# Patient Record
Sex: Female | Born: 1950 | Race: White | Hispanic: No | Marital: Married | Smoking: Never smoker
Health system: Southern US, Community
[De-identification: ages and names within clinical notes are randomized; demographics above are authoritative.]

## PROBLEM LIST (undated history)

## (undated) DIAGNOSIS — E785 Hyperlipidemia, unspecified: Secondary | ICD-10-CM

## (undated) DIAGNOSIS — T8859XA Other complications of anesthesia, initial encounter: Secondary | ICD-10-CM

## (undated) DIAGNOSIS — E119 Type 2 diabetes mellitus without complications: Secondary | ICD-10-CM

## (undated) DIAGNOSIS — IMO0001 Reserved for inherently not codable concepts without codable children: Secondary | ICD-10-CM

## (undated) DIAGNOSIS — T7840XA Allergy, unspecified, initial encounter: Secondary | ICD-10-CM

## (undated) DIAGNOSIS — T4145XA Adverse effect of unspecified anesthetic, initial encounter: Secondary | ICD-10-CM

## (undated) DIAGNOSIS — K219 Gastro-esophageal reflux disease without esophagitis: Secondary | ICD-10-CM

## (undated) DIAGNOSIS — M810 Age-related osteoporosis without current pathological fracture: Secondary | ICD-10-CM

## (undated) DIAGNOSIS — O1490 Unspecified pre-eclampsia, unspecified trimester: Secondary | ICD-10-CM

## (undated) HISTORY — DX: Type 2 diabetes mellitus without complications: E11.9

## (undated) HISTORY — DX: Allergy, unspecified, initial encounter: T78.40XA

## (undated) HISTORY — DX: Unspecified pre-eclampsia, unspecified trimester: O14.90

## (undated) HISTORY — DX: Age-related osteoporosis without current pathological fracture: M81.0

## (undated) HISTORY — DX: Other complications of anesthesia, initial encounter: T88.59XA

## (undated) HISTORY — DX: Adverse effect of unspecified anesthetic, initial encounter: T41.45XA

## (undated) HISTORY — DX: Gastro-esophageal reflux disease without esophagitis: K21.9

## (undated) HISTORY — DX: Hyperlipidemia, unspecified: E78.5

## (undated) HISTORY — PX: COLONOSCOPY: SHX174

## (undated) HISTORY — DX: Reserved for inherently not codable concepts without codable children: IMO0001

## (undated) HISTORY — PX: POLYPECTOMY: SHX149

---

## 2002-08-05 ENCOUNTER — Other Ambulatory Visit: Admission: RE | Admit: 2002-08-05 | Discharge: 2002-08-05 | Payer: Self-pay | Admitting: Family Medicine

## 2004-09-11 ENCOUNTER — Other Ambulatory Visit: Admission: RE | Admit: 2004-09-11 | Discharge: 2004-09-11 | Payer: Self-pay | Admitting: Family Medicine

## 2004-10-16 HISTORY — PX: CHOLECYSTECTOMY: SHX55

## 2004-10-22 ENCOUNTER — Other Ambulatory Visit: Payer: Self-pay

## 2004-10-29 ENCOUNTER — Ambulatory Visit: Payer: Self-pay | Admitting: Surgery

## 2005-09-20 ENCOUNTER — Ambulatory Visit: Payer: Self-pay | Admitting: Family Medicine

## 2006-04-07 ENCOUNTER — Ambulatory Visit: Payer: Self-pay | Admitting: Internal Medicine

## 2006-06-26 ENCOUNTER — Ambulatory Visit: Payer: Self-pay | Admitting: Family Medicine

## 2006-10-02 ENCOUNTER — Ambulatory Visit: Payer: Self-pay | Admitting: Family Medicine

## 2006-10-02 ENCOUNTER — Other Ambulatory Visit: Admission: RE | Admit: 2006-10-02 | Discharge: 2006-10-02 | Payer: Self-pay | Admitting: Family Medicine

## 2006-10-02 ENCOUNTER — Encounter: Payer: Self-pay | Admitting: Family Medicine

## 2006-10-02 LAB — CONVERTED CEMR LAB: Pap Smear: NORMAL

## 2006-10-24 LAB — FECAL OCCULT BLOOD, GUAIAC: Fecal Occult Blood: NEGATIVE

## 2006-10-27 ENCOUNTER — Ambulatory Visit: Payer: Self-pay | Admitting: Family Medicine

## 2006-11-13 ENCOUNTER — Ambulatory Visit: Payer: Self-pay | Admitting: Family Medicine

## 2006-11-27 ENCOUNTER — Ambulatory Visit: Payer: Self-pay | Admitting: Family Medicine

## 2007-02-10 ENCOUNTER — Ambulatory Visit: Payer: Self-pay | Admitting: Family Medicine

## 2007-04-07 ENCOUNTER — Ambulatory Visit: Payer: Self-pay | Admitting: Family Medicine

## 2007-04-07 LAB — CONVERTED CEMR LAB
ALT: 34 units/L (ref 0–40)
AST: 23 units/L (ref 0–37)
Albumin: 4 g/dL (ref 3.5–5.2)
Alkaline Phosphatase: 76 units/L (ref 39–117)
BUN: 7 mg/dL (ref 6–23)
Bilirubin, Direct: 0.1 mg/dL (ref 0.0–0.3)
CO2: 32 meq/L (ref 19–32)
Calcium: 8.9 mg/dL (ref 8.4–10.5)
Chloride: 103 meq/L (ref 96–112)
Creatinine, Ser: 0.6 mg/dL (ref 0.4–1.2)
Creatinine,U: 130.7 mg/dL
GFR calc Af Amer: 134 mL/min
GFR calc non Af Amer: 111 mL/min
Glucose, Bld: 172 mg/dL — ABNORMAL HIGH (ref 70–99)
Hgb A1c MFr Bld: 7.4 %
Hgb A1c MFr Bld: 7.4 % — ABNORMAL HIGH (ref 4.6–6.0)
Microalb Creat Ratio: 5.4 mg/g (ref 0.0–30.0)
Microalb, Ur: 0.7 mg/dL (ref 0.0–1.9)
Phosphorus: 4.1 mg/dL (ref 2.3–4.6)
Potassium: 4 meq/L (ref 3.5–5.1)
Sodium: 141 meq/L (ref 135–145)
Total Bilirubin: 0.7 mg/dL (ref 0.3–1.2)
Total Protein: 6.7 g/dL (ref 6.0–8.3)

## 2007-07-07 ENCOUNTER — Ambulatory Visit: Payer: Self-pay | Admitting: Family Medicine

## 2007-07-07 ENCOUNTER — Telehealth (INDEPENDENT_AMBULATORY_CARE_PROVIDER_SITE_OTHER): Payer: Self-pay | Admitting: *Deleted

## 2007-07-07 DIAGNOSIS — J45909 Unspecified asthma, uncomplicated: Secondary | ICD-10-CM | POA: Insufficient documentation

## 2007-07-07 DIAGNOSIS — I10 Essential (primary) hypertension: Secondary | ICD-10-CM | POA: Insufficient documentation

## 2007-07-07 DIAGNOSIS — J309 Allergic rhinitis, unspecified: Secondary | ICD-10-CM | POA: Insufficient documentation

## 2007-07-07 DIAGNOSIS — E7800 Pure hypercholesterolemia, unspecified: Secondary | ICD-10-CM

## 2007-07-07 DIAGNOSIS — E119 Type 2 diabetes mellitus without complications: Secondary | ICD-10-CM

## 2007-07-08 ENCOUNTER — Ambulatory Visit: Payer: Self-pay | Admitting: Family Medicine

## 2007-07-08 DIAGNOSIS — T753XXA Motion sickness, initial encounter: Secondary | ICD-10-CM | POA: Insufficient documentation

## 2007-07-09 LAB — CONVERTED CEMR LAB
ALT: 39 units/L — ABNORMAL HIGH (ref 0–35)
AST: 24 units/L (ref 0–37)
Cholesterol: 148 mg/dL (ref 0–200)
HDL: 25 mg/dL — ABNORMAL LOW (ref >39.0)
Hgb A1c MFr Bld: 6.9 % — ABNORMAL HIGH (ref 4.6–6.0)
LDL Cholesterol: 101 mg/dL — ABNORMAL HIGH (ref 0–99)
Total CHOL/HDL Ratio: 5.9
Triglycerides: 111 mg/dL (ref 0–149)
VLDL: 22 mg/dL (ref 0–40)

## 2007-09-22 ENCOUNTER — Telehealth: Payer: Self-pay | Admitting: Family Medicine

## 2007-10-09 ENCOUNTER — Ambulatory Visit: Payer: Self-pay | Admitting: Family Medicine

## 2007-10-12 ENCOUNTER — Encounter: Payer: Self-pay | Admitting: Family Medicine

## 2007-10-12 LAB — CONVERTED CEMR LAB
ALT: 31 units/L (ref 0–35)
Creatinine,U: 93.8 mg/dL
LDL Cholesterol: 112 mg/dL — ABNORMAL HIGH (ref 0–99)
Microalb, Ur: 0.4 mg/dL (ref 0.0–1.9)
Total CHOL/HDL Ratio: 4.7

## 2007-10-20 ENCOUNTER — Encounter (INDEPENDENT_AMBULATORY_CARE_PROVIDER_SITE_OTHER): Payer: Self-pay | Admitting: *Deleted

## 2007-10-20 LAB — HM MAMMOGRAPHY: HM Mammogram: NORMAL

## 2008-01-29 ENCOUNTER — Telehealth: Payer: Self-pay | Admitting: Family Medicine

## 2008-02-14 HISTORY — PX: NASAL SINUS SURGERY: SHX719

## 2008-03-01 ENCOUNTER — Encounter: Payer: Self-pay | Admitting: Family Medicine

## 2008-03-01 ENCOUNTER — Ambulatory Visit: Payer: Self-pay | Admitting: Unknown Physician Specialty

## 2008-03-01 ENCOUNTER — Other Ambulatory Visit: Payer: Self-pay

## 2008-03-02 ENCOUNTER — Ambulatory Visit: Payer: Self-pay | Admitting: Family Medicine

## 2008-03-02 ENCOUNTER — Telehealth: Payer: Self-pay | Admitting: Family Medicine

## 2008-03-03 ENCOUNTER — Telehealth: Payer: Self-pay | Admitting: Family Medicine

## 2008-03-08 ENCOUNTER — Ambulatory Visit: Payer: Self-pay | Admitting: Unknown Physician Specialty

## 2008-05-02 ENCOUNTER — Ambulatory Visit: Payer: Self-pay | Admitting: Family Medicine

## 2008-05-02 ENCOUNTER — Encounter (INDEPENDENT_AMBULATORY_CARE_PROVIDER_SITE_OTHER): Payer: Self-pay | Admitting: *Deleted

## 2008-05-05 LAB — CONVERTED CEMR LAB
CO2: 31 meq/L (ref 19–32)
Glucose, Bld: 112 mg/dL — ABNORMAL HIGH (ref 70–99)
HDL: 33.5 mg/dL — ABNORMAL LOW (ref >39.0)
Hgb A1c MFr Bld: 6.6 % — ABNORMAL HIGH (ref 4.6–6.0)
Potassium: 3.9 meq/L (ref 3.5–5.1)
Sodium: 142 meq/L (ref 135–145)
Total Bilirubin: 0.6 mg/dL (ref 0.3–1.2)
Total CHOL/HDL Ratio: 5.5
VLDL: 20 mg/dL (ref 0–40)

## 2008-05-27 ENCOUNTER — Ambulatory Visit: Payer: Self-pay | Admitting: Family Medicine

## 2008-05-27 DIAGNOSIS — E663 Overweight: Secondary | ICD-10-CM | POA: Insufficient documentation

## 2008-08-17 ENCOUNTER — Encounter: Payer: Self-pay | Admitting: Family Medicine

## 2008-08-18 ENCOUNTER — Encounter: Payer: Self-pay | Admitting: Family Medicine

## 2008-09-02 ENCOUNTER — Ambulatory Visit: Payer: Self-pay | Admitting: Family Medicine

## 2008-09-06 ENCOUNTER — Ambulatory Visit: Payer: Self-pay | Admitting: Family Medicine

## 2008-09-06 LAB — CONVERTED CEMR LAB
ALT: 25 units/L (ref 0–35)
AST: 15 units/L (ref 0–37)
Cholesterol: 146 mg/dL (ref 0–200)
HDL: 33.7 mg/dL — ABNORMAL LOW (ref >39.0)
VLDL: 21 mg/dL (ref 0–40)

## 2008-09-07 ENCOUNTER — Ambulatory Visit: Payer: Self-pay | Admitting: Family Medicine

## 2008-09-20 ENCOUNTER — Ambulatory Visit: Payer: Self-pay | Admitting: Family Medicine

## 2008-09-28 ENCOUNTER — Encounter: Payer: Self-pay | Admitting: Family Medicine

## 2008-10-04 ENCOUNTER — Ambulatory Visit: Payer: Self-pay | Admitting: Family Medicine

## 2009-02-01 ENCOUNTER — Encounter: Payer: Self-pay | Admitting: Family Medicine

## 2009-03-07 ENCOUNTER — Ambulatory Visit: Payer: Self-pay | Admitting: Family Medicine

## 2009-03-08 LAB — CONVERTED CEMR LAB
AST: 22 units/L (ref 0–37)
HDL: 37.8 mg/dL — ABNORMAL LOW (ref >39.00)
Hgb A1c MFr Bld: 6.9 % — ABNORMAL HIGH (ref 4.6–6.5)
Potassium: 4.1 meq/L (ref 3.5–5.1)
Sodium: 140 meq/L (ref 135–145)
Triglycerides: 103 mg/dL (ref 0.0–149.0)

## 2009-04-04 ENCOUNTER — Telehealth (INDEPENDENT_AMBULATORY_CARE_PROVIDER_SITE_OTHER): Payer: Self-pay | Admitting: *Deleted

## 2009-07-16 LAB — HM DIABETES EYE EXAM: HM Diabetic Eye Exam: NORMAL

## 2009-07-24 ENCOUNTER — Ambulatory Visit: Payer: Self-pay | Admitting: Family Medicine

## 2009-07-25 LAB — CONVERTED CEMR LAB
ALT: 36 units/L — ABNORMAL HIGH (ref 0–35)
AST: 21 units/L (ref 0–37)
Alkaline Phosphatase: 82 units/L (ref 39–117)
Basophils Relative: 0.4 % (ref 0.0–3.0)
Bilirubin, Direct: 0 mg/dL (ref 0.0–0.3)
Calcium: 9.1 mg/dL (ref 8.4–10.5)
Eosinophils Relative: 6.2 % — ABNORMAL HIGH (ref 0.0–5.0)
Glucose, Bld: 138 mg/dL — ABNORMAL HIGH (ref 70–99)
Hemoglobin: 14.1 g/dL (ref 12.0–15.0)
Lymphocytes Relative: 31.1 % (ref 12.0–46.0)
MCHC: 33.5 g/dL (ref 30.0–36.0)
Monocytes Relative: 7.2 % (ref 3.0–12.0)
Neutro Abs: 4.8 10*3/uL (ref 1.4–7.7)
Phosphorus: 3.9 mg/dL (ref 2.3–4.6)
Potassium: 4.1 meq/L (ref 3.5–5.1)
RBC: 4.46 M/uL (ref 3.87–5.11)
Sodium: 142 meq/L (ref 135–145)
Total CHOL/HDL Ratio: 5
Total Protein: 7.3 g/dL (ref 6.0–8.3)
VLDL: 24.6 mg/dL (ref 0.0–40.0)
WBC: 8.5 10*3/uL (ref 4.5–10.5)

## 2009-07-26 LAB — CONVERTED CEMR LAB: Hgb A1c MFr Bld: 7.2 % — ABNORMAL HIGH (ref 4.6–6.5)

## 2010-07-11 ENCOUNTER — Encounter: Payer: Self-pay | Admitting: Family Medicine

## 2010-09-28 ENCOUNTER — Ambulatory Visit: Payer: Self-pay | Admitting: Family Medicine

## 2010-09-30 LAB — CONVERTED CEMR LAB
ALT: 34 units/L (ref 0–35)
AST: 17 units/L (ref 0–37)
Hgb A1c MFr Bld: 7.3 % — ABNORMAL HIGH (ref 4.6–6.5)

## 2010-10-03 ENCOUNTER — Ambulatory Visit: Payer: Self-pay | Admitting: Family Medicine

## 2010-12-07 ENCOUNTER — Ambulatory Visit: Payer: Self-pay | Admitting: Internal Medicine

## 2011-01-02 ENCOUNTER — Ambulatory Visit
Admission: RE | Admit: 2011-01-02 | Discharge: 2011-01-02 | Payer: Self-pay | Source: Home / Self Care | Attending: Family Medicine | Admitting: Family Medicine

## 2011-01-02 ENCOUNTER — Other Ambulatory Visit: Payer: Self-pay | Admitting: Family Medicine

## 2011-01-02 LAB — LIPID PANEL
Cholesterol: 151 mg/dL (ref 0–200)
HDL: 32.2 mg/dL — ABNORMAL LOW (ref >39.00)
LDL Cholesterol: 102 mg/dL — ABNORMAL HIGH (ref 0–99)
Total CHOL/HDL Ratio: 5
Triglycerides: 85 mg/dL (ref 0.0–149.0)
VLDL: 17 mg/dL (ref 0.0–40.0)

## 2011-01-02 LAB — HEMOGLOBIN A1C: Hgb A1c MFr Bld: 7.4 % — ABNORMAL HIGH (ref 4.6–6.5)

## 2011-01-02 LAB — RENAL FUNCTION PANEL
Albumin: 4.1 g/dL (ref 3.5–5.2)
BUN: 7 mg/dL (ref 6–23)
CO2: 30 mEq/L (ref 19–32)
Calcium: 9.3 mg/dL (ref 8.4–10.5)
Chloride: 100 mEq/L (ref 96–112)
Creatinine, Ser: 0.4 mg/dL (ref 0.4–1.2)
GFR: 179.01 mL/min (ref >60.00)
Glucose, Bld: 131 mg/dL — ABNORMAL HIGH (ref 70–99)
Phosphorus: 4.5 mg/dL (ref 2.3–4.6)
Potassium: 4.3 mEq/L (ref 3.5–5.1)
Sodium: 137 mEq/L (ref 135–145)

## 2011-01-02 LAB — ALT: ALT: 32 U/L (ref 0–35)

## 2011-01-02 LAB — AST: AST: 22 U/L (ref 0–37)

## 2011-01-09 ENCOUNTER — Ambulatory Visit
Admission: RE | Admit: 2011-01-09 | Discharge: 2011-01-09 | Payer: Self-pay | Source: Home / Self Care | Attending: Family Medicine | Admitting: Family Medicine

## 2011-01-09 LAB — HM DIABETES FOOT EXAM

## 2011-01-15 NOTE — Letter (Signed)
Summary: [facility]  [facility]   Imported By: Maryln Gottron 07/27/2010 12:32:42  _____________________________________________________________________  External Attachment:    Type:   Image     Comment:   External Document

## 2011-01-15 NOTE — Assessment & Plan Note (Signed)
Summary: F/U/CLE   Vital Signs:  Patient profile:   60 year old female Height:      64.25 inches Weight:      200 pounds BMI:     34.19 Temp:     98.2 degrees F oral Pulse rate:   96 / minute Pulse rhythm:   regular BP sitting:   164 / 94  (left arm) Cuff size:   regular  Vitals Entered By: Lewanda Rife LPN (10/27/10 9:18 AM) CC: follow up for med refills   History of Present Illness: here for f/u of HTN and DM an llipids   wt is up 12 lb  bp is 164/94 hx of white coat HTN has not been checking at home   DM -- AIC 7.3 up from 7.2 is not eating much differently  is eating a diabetic diet   lipids up LDL is 166 from 129 on caduet   noncompliant with appts-- caring for her mother with health problems  also dementia   she does still walk every night 1 mile 20-30 minutes in neighborhood  not as much exercise as she used to  just joined [employer]   working part time as a Social worker    Allergies: 1)  ! * Xopenex  Past History:  Past Medical History: Last updated: 10/04/2008 Allergic rhinitis Asthma- mild Diabetes mellitus, type II Hypertension- "white coat" Toxemia X 2 during pregnancy chronic sinusitis/ had nasal surgery  Past Surgical History: Last updated: 09/06/2008 Caesarean section X 2 Cholecystectomy- gallstones (10/2004) nasal surg--3/09  Family History: Last updated: 27-Oct-2010 Father: HTN. died in garden from ? heat stroke or ?heart problems Mother: heart, DM, dementia  brother RA, kidney cancer   Social History: Last updated: 03/02/2008 Marital Status: Married Children: 2 Occupation:  Never Smoked Alcohol use-no occasional caffine   Risk Factors: Smoking Status: never (03/02/2008)  Family History: Father: HTN. died in garden from ? heat stroke or ?heart problems Mother: heart, DM, dementia  brother RA, kidney cancer   Review of Systems General:  Complains of fatigue; denies fever, loss of appetite, and malaise. Eyes:  Denies  blurring and eye irritation. CV:  Denies chest pain or discomfort, lightheadness, palpitations, and shortness of breath with exertion. Resp:  Denies cough and wheezing. GI:  Denies abdominal pain, bloody stools, change in bowel habits, and indigestion. GU:  Denies urinary frequency. MS:  Denies muscle aches and cramps. Derm:  Denies itching, lesion(s), poor wound healing, and rash. Neuro:  Denies numbness and tingling. Endo:  Denies excessive thirst and excessive urination. Heme:  Denies abnormal bruising and bleeding.  Physical Exam  General:  overweight but generally well appearing  Head:  normocephalic, atraumatic, and no abnormalities observed.   Eyes:  vision grossly intact, pupils equal, pupils round, and pupils reactive to light.  no conjunctival pallor, injection or icterus  Mouth:  pharynx pink and moist.   Neck:  supple with full rom and no masses or thyromegally, no JVD or carotid bruit  Lungs:  Normal respiratory effort, chest expands symmetrically. Lungs are clear to auscultation, no crackles or wheezes. Heart:  Normal rate and regular rhythm. S1 and S2 normal without gallop, murmur, click, rub or other extra sounds. Abdomen:  no renal bruits soft, non-tender, normal bowel sounds, no distention, no masses, no hepatomegaly, and no splenomegaly.   Msk:  No deformity or scoliosis noted of thoracic or lumbar spine.   Pulses:  R and L carotid,radial,femoral,dorsalis pedis and posterior tibial pulses are full  and equal bilaterally Extremities:  No clubbing, cyanosis, edema, or deformity noted with normal full range of motion of all joints.   Neurologic:  sensation intact to light touch, gait normal, and DTRs symmetrical and normal.   Skin:  Intact without suspicious lesions or rashes Cervical Nodes:  No lymphadenopathy noted Inguinal Nodes:  No significant adenopathy Psych:  normal affect, talkative and pleasant  seems stressed and somewhat fatigued   Diabetes Management Exam:     Foot Exam (with socks and/or shoes not present):       Sensory-Pinprick/Light touch:          Left medial foot (L-4): normal          Left dorsal foot (L-5): normal          Left lateral foot (S-1): normal          Right medial foot (L-4): normal          Right dorsal foot (L-5): normal          Right lateral foot (S-1): normal       Sensory-Monofilament:          Left foot: normal          Right foot: normal       Inspection:          Left foot: normal          Right foot: normal       Nails:          Left foot: normal          Right foot: normal   Impression & Recommendations:  Problem # 1:  HYPERTENSION (ICD-401.9) Assessment Deteriorated  bp is up  pt is off caduet and hyzaar went generic  she will start back on caduet check at home exercise f/u 3 mo  consider change in arb if not imp Her updated medication list for this problem includes:    Caduet 5-10 Mg Tabs (Amlodipine-atorvastatin) .Marland Kitchen... Take one by mouth daily    Hyzaar 100-25 Mg Tabs (Losartan potassium-hctz) .Marland Kitchen... 1 by mouth once daily  Orders: Prescription Created Electronically [phone])  Problem # 2:  DIABETES MELLITUS, TYPE II (ICD-250.00) Assessment: Deteriorated  slt worse will get back to diet/exercise and wt loss if not imp will inc metformin lab and f/u in3 mo Her updated medication list for this problem includes:    Glucotrol Xl 5 Mg Tb24 (Glipizide) .Marland Kitchen... Take one by mouth daily    Metformin Hcl 500 Mg Tabs (Metformin hcl) .Marland Kitchen... Take one half by mouth q hs    Hyzaar 100-25 Mg Tabs (Losartan potassium-hctz) .Marland Kitchen... 1 by mouth once daily  Labs Reviewed: Creat: 0.5 (07/24/2009)     Last Eye Exam: normal (07/16/2009) Reviewed HgBA1c results: 7.3 (09/28/2010)  7.2 (07/24/2009)  Orders: Prescription Created Electronically [phone])  Problem # 3:  HYPERCHOLESTEROLEMIA, PURE (ICD-272.0) Assessment: Deteriorated  worse off caduet  work on diet and start back lab and f/u 3 mo  Her updated  medication list for this problem includes:    Caduet 5-10 Mg Tabs (Amlodipine-atorvastatin) .Marland Kitchen... Take one by mouth daily  Labs Reviewed: SGOT: 17 (09/28/2010)   SGPT: 34 (09/28/2010)   HDL:36.80 (09/28/2010), 38.90 (07/24/2009)  LDL:129 (07/24/2009), 102 (03/07/2009)  Chol:240 (09/28/2010), 192 (07/24/2009)  Trig:154.0 (09/28/2010), 123.0 (07/24/2009)  Orders: Prescription Created Electronically [phone])  Complete Medication List: 1)  Glucotrol Xl 5 Mg Tb24 (Glipizide) .... Take one by mouth daily 2)  Caduet 5-10 Mg Tabs (Amlodipine-atorvastatin) .... Take  one by mouth daily 3)  Metformin Hcl 500 Mg Tabs (Metformin hcl) .... Take one half by mouth q hs 4)  Zyrtec Allergy 10 Mg Tabs (Cetirizine hcl) .... Take one by mouth daily 5)  Flonase 50 Mcg/act Susp (Fluticasone propionate) .... 2 sp in each nostril once daily 6)  Freestyle Test Strp (Glucose blood) .... Check blood sugar as directed 7)  Proair Hfa 108 (90 Base) Mcg/act Aers (Albuterol sulfate) .... As needed 8)  Alvesco 160 Mcg/act Aers (Ciclesonide) .... One puff two times a day as needed 9)  Hyzaar 100-25 Mg Tabs (Losartan potassium-hctz) .Marland Kitchen.. 1 by mouth once daily 10)  Ketoconazole 2 % Crea (Ketoconazole) .... Apply to affected area once daily 11)  Caltrate 600+d 600-400 Mg-unit Chew (Calcium carbonate-vitamin d) .... Chews two daily  Patient Instructions: 1)  I sent px to your pharmacy 2)  stick to diabetic diet  3)  cut portions by 1/4 to 1/3  4)  get back to the gym - optimally 5 days per week  5)  check blood pressure at home  6)  start back on caduet  7)  schedule fasting labs in 3 months then follow up lipid/ast/alt/renal / AIC / 250.0 , 272  8)  please have pt talk to [facility] or Aram Beecham on way out regarding problem with appt cancellation Prescriptions: HYZAAR 100-25 MG TABS (LOSARTAN POTASSIUM-HCTZ) 1 by mouth once daily  #30 x 11   Entered and Authorized by:   Judith Part MD   Signed by:   Judith Part MD on  10/03/2010   Method used:   Electron[facility] to        [facility] [phone][facility] (retail)       [address] [place], [place]  [address]       Ph: [phone] or [phone]       Fax: [phone]   RxID:   [id number] FLONASE 50 MCG/ACT  SUSP (FLUTICASONE PROPIONATE) 2 sp in each nostril once daily  #1 mdi x 11   Entered and Authorized by:   Judith Part MD   Signed by:   Judith Part MD on 10/03/2010   Method used:   Electronically to        [facility] [phone]* (retail)       [address] [place], [place]  [address]       Ph: [phone] or [phone]       Fax: [phone]   RxID:   [id number] METFORMIN HCL 500 MG  TABS (METFORMIN HCL) take one half by mouth q hs  #15 x 11   Entered and Authorized by:   Judith Part MD   Signed by:   Judith Part MD on 10/03/2010   Method used:   Electronically to        [facility] [phone]* (retail)       [address] [place], [place]  [address]       Ph: [phone] or [phone]       Fax: [phone]   RxID:   [id number] CADUET 5-10 MG  TABS (AMLODIPINE-ATORVASTATIN) take one by mouth daily  #30 x 11   Entered and Authorized by:  Judith Part MD   Signed by:   Judith Part MD on 10/03/2010   Method used:   Electronically to        [facility] [phone]* (retail)       [address] [place], [place]  [address]       Ph: [phone] or [phone]       Fax: [phone]   RxID:   [id number] GLUCOTROL XL 5 MG  TB24 (GLIPIZIDE) take one by mouth daily  #30 x 11   Entered and Authorized by:   Judith Part MD   Signed by:   Judith Part MD on 10/03/2010   Method used:   Electronically to        [facility] [phone]* (retail)       [address] [place], [place]  [address]       Ph: [phone] or [phone]       Fax: [phone]   RxID:    [id number]    Orders Added: 1)  Prescription Created Electronically [G8553] 2)  Est. Patient Level IV [23762]    Current Allergies (reviewed today): ! Pauline Aus

## 2011-01-17 NOTE — Assessment & Plan Note (Signed)
Summary: FELL AND TOOK BLOW TO LEFT SIDE/DLO   Vital Signs:  Patient profile:   61 year old female Weight:      199 pounds Temp:     98.3 degrees F oral Pulse rate:   104 / minute Pulse rhythm:   regular BP sitting:   136 / 80  (left arm) Cuff size:   large  Vitals Entered By: Selena Batten Dance CMA Duncan Dull) (December 07, 2010 3:40 PM) CC: Left ribcage painful after fall 1 week ago   History of Present Illness: CC: fall at campus  DOI: 12/01/2010  Fall Saturday at [facility].  fell on uneven pavement on L side, elbow bruise, knee pain.  since then, left chest pain.  Feels breath catching with deep breath and pain left side from breast to posterior ribs.  sleeping on recliner.  tylenol for pain.  No SOB.  No cough.  no dizziness.  no bleeding or bruising.  no smokers at home.  tachycardic today, pt states that's her norm.  Current Medications (verified): 1)  Glucotrol Xl 5 Mg  Tb24 (Glipizide) .... Take One By Mouth Daily 2)  Caduet 5-10 Mg  Tabs (Amlodipine-Atorvastatin) .... Take One By Mouth Daily 3)  Metformin Hcl 500 Mg  Tabs (Metformin Hcl) .... Take One Half By Mouth Q Hs 4)  Zyrtec Allergy 10 Mg  Tabs (Cetirizine Hcl) .... Take One By Mouth Daily 5)  Flonase 50 Mcg/act  Susp (Fluticasone Propionate) .... 2 Sp in Each Nostril Once Daily 6)  Freestyle Test   Strp (Glucose Blood) .... Check Blood Sugar As Directed 7)  Proair Hfa 108 (90 Base) Mcg/act Aers (Albuterol Sulfate) .... As Needed 8)  Alvesco 160 Mcg/act Aers (Ciclesonide) .... One Puff Two Times A Day As Needed 9)  Hyzaar 100-25 Mg Tabs (Losartan Potassium-Hctz) .Marland Kitchen.. 1 By Mouth Once Daily 10)  Ketoconazole 2 % Crea (Ketoconazole) .... Apply To Affected Area Once Daily 11)  Caltrate 600+d 600-400 Mg-Unit Chew (Calcium Carbonate-Vitamin D) .... Chews Two Daily  Allergies: 1)  ! * Xopenex  Past History:  Past Medical History: Last updated: 10/04/2008 Allergic rhinitis Asthma- mild Diabetes mellitus, type II Hypertension-  "white coat" Toxemia X 2 during pregnancy chronic sinusitis/ had nasal surgery  Social History: Last updated: 03/02/2008 Marital Status: Married Children: 2 Occupation:  Never Smoked Alcohol use-no occasional caffine   Review of Systems       per HPI  Physical Exam  General:  overweight but generally well appearing  Lungs:  Normal respiratory effort, chest expands symmetrically. Lungs are clear to auscultation, no crackles or wheezes. Heart:  Normal rate and regular rhythm. S1 and S2 normal without gallop, murmur, click, rub or other extra sounds. Abdomen:  no renal bruits soft, non-tender, normal bowel sounds, no distention, no masses, no hepatomegaly, and no splenomegaly.  no CVA tenderness. Msk:  bilateral knees FROM.  tender to palpation below L fibial lateral ligament.  Neg mcmurrays Pulses:  2+ rad pulses Extremities:  no pedal edema Neurologic:  sensation intact to light touch, gait normal, and DTRs symmetrical and normal.     Impression & Recommendations:  Problem # 1:  RIB PAIN, LEFT SIDED (ICD-786.50) likely bony contusion rib.  Xray negative for fracture.  supportive care.  red flags to return discussed - worsening SOB, worsening pain, dizziness etc.  discussed anticipated recovery course.  Orders: T-Ribs Unilateral 2 Views (71100TC)  Problem # 2:  KNEE PAIN, LEFT, ACUTE (ICD-719.46) likely bony contusion.  supportive care.  return if worse.  Complete Medication List: 1)  Glucotrol Xl 5 Mg Tb24 (Glipizide) .... Take one by mouth daily 2)  Caduet 5-10 Mg Tabs (Amlodipine-atorvastatin) .... Take one by mouth daily 3)  Metformin Hcl 500 Mg Tabs (Metformin hcl) .... Take one half by mouth q hs 4)  Zyrtec Allergy 10 Mg Tabs (Cetirizine hcl) .... Take one by mouth daily 5)  Flonase 50 Mcg/act Susp (Fluticasone propionate) .... 2 sp in each nostril once daily 6)  Freestyle Test Strp (Glucose blood) .... Check blood sugar as directed 7)  Proair Hfa 108 (90 Base)  Mcg/act Aers (Albuterol sulfate) .... As needed 8)  Alvesco 160 Mcg/act Aers (Ciclesonide) .... One puff two times a day as needed 9)  Hyzaar 100-25 Mg Tabs (Losartan potassium-hctz) .Marland Kitchen.. 1 by mouth once daily 10)  Ketoconazole 2 % Crea (Ketoconazole) .... Apply to affected area once daily 11)  Caltrate 600+d 600-400 Mg-unit Chew (Calcium carbonate-vitamin d) .... Chews two daily   Patient Instructions: 1)  Xray looking ok on my read.  we will call you if any change based on radiology read. 2)  advil and heating pad. 3)  update [place] if not better in 1-2 weeks, sooner if worse 4)  Good to see you today, call clinic with questions.   Orders Added: 1)  T-Ribs Unilateral 2 Views [71100TC] 2)  Est. Patient Level III [29562]    Current Allergies (reviewed today): ! Pauline Aus

## 2011-01-23 NOTE — Assessment & Plan Note (Signed)
Summary: 3 MONTH FOLLOW UP/[facility]   Vital Signs:  Patient profile:   60 year old female Height:      64.25 inches Weight:      197.75 pounds BMI:     33.80 Temp:     98 degrees F oral Pulse rate:   96 / minute Pulse rhythm:   regular BP sitting:   144 / 76  (left arm) Cuff size:   large  Vitals Entered By: Lewanda Rife LPN (January 09, 2011 8:11 AM)  Serial Vital Signs/Assessments:  Time      Position  BP       Pulse  Resp  Temp     By                     130/80                         Judith Part MD  CC: three month f/u   History of Present Illness: is doing ok overall  had a fall before holidays -- got some x rays and was ok  nothing broken  contused rib  otherwise ok   wt is down 2 lb  bp is up a bit -- whitecoat   got zumba exercise videos for christmas- really likes it   cholesterol is much better back on on caduet -- changed to generic   AIC is 7.4 checks it in am 82-125   occ checks in the evenings -- never over 140   check it 2 hours after dinner   is sticking to a diabetic diet  lost a little wt  oatmeal bkfast/ soup for lunch / dinner -- broccoli / salad/ grilled chicken no sweets no sweet drinks - watches bread / potato / pasta  Allergies: 1)  ! * Xopenex  Past History:  Past Medical History: Last updated: 10/04/2008 Allergic rhinitis Asthma- mild Diabetes mellitus, type II Hypertension- "white coat" Toxemia X 2 during pregnancy chronic sinusitis/ had nasal surgery  Past Surgical History: Last updated: 09/06/2008 Caesarean section X 2 Cholecystectomy- gallstones (10/2004) nasal surg--3/09  Family History: Last updated: 10-21-10 Father: HTN. died in garden from ? heat stroke or ?heart problems Mother: heart, DM, dementia  brother RA, kidney cancer   Social History: Last updated: 03/02/2008 Marital Status: Married Children: 2 Occupation:  Never Smoked Alcohol use-no occasional caffine   Risk Factors: Smoking Status: never  (03/02/2008)  Review of Systems General:  Denies fatigue, loss of appetite, and malaise. Eyes:  Denies blurring and eye irritation. CV:  Denies chest pain or discomfort, lightheadness, and palpitations. Resp:  Denies cough, shortness of breath, and wheezing. GI:  Denies abdominal pain and change in bowel habits. GU:  Denies dysuria and urinary frequency. MS:  occ r leg gets tingly when she stands for a long time . Derm:  Denies itching, lesion(s), poor wound healing, and rash. Neuro:  Denies numbness and tingling. Psych:  mood is ok . Endo:  Denies cold intolerance and heat intolerance. Heme:  Denies abnormal bruising and bleeding.  Physical Exam  General:  overweight but generally well appearing  Head:  normocephalic, atraumatic, and no abnormalities observed.   Eyes:  vision grossly intact, pupils equal, pupils round, and pupils reactive to light.  no conjunctival pallor, injection or icterus  Mouth:  pharynx pink and moist.   Neck:  supple with full rom and no masses or thyromegally, no JVD or carotid bruit  Chest Wall:  No deformities, masses, or tenderness noted. Lungs:  Normal respiratory effort, chest expands symmetrically. Lungs are clear to auscultation, no crackles or wheezes. Heart:  Normal rate and regular rhythm. S1 and S2 normal without gallop, murmur, click, rub or other extra sounds. Abdomen:  Bowel sounds positive,abdomen soft and non-tender without masses, organomegaly or hernias noted. no renal bruits obese abd baseline scars  Msk:  No deformity or scoliosis noted of thoracic or lumbar spine.   Pulses:  2+ rad pulses Extremities:  no pedal edema Neurologic:  sensation intact to light touch, gait normal, and DTRs symmetrical and normal.   Skin:  Intact without suspicious lesions or rashes Cervical Nodes:  No lymphadenopathy noted Inguinal Nodes:  No significant adenopathy Psych:  normal affect, talkative and pleasant   Diabetes Management Exam:    Foot Exam  (with socks and/or shoes not present):       Sensory-Pinprick/Light touch:          Left medial foot (L-4): normal          Left dorsal foot (L-5): normal          Left lateral foot (S-1): normal          Right medial foot (L-4): normal          Right dorsal foot (L-5): normal          Right lateral foot (S-1): normal       Sensory-Monofilament:          Left foot: normal          Right foot: normal       Inspection:          Left foot: normal          Right foot: normal       Nails:          Left foot: normal          Right foot: normal   Impression & Recommendations:  Problem # 1:  HYPERTENSION (ICD-401.9) Assessment Unchanged  better on 2nd check  continue current meds/ wt loss effort Her updated medication list for this problem includes:    Caduet 5-10 Mg Tabs (Amlodipine-atorvastatin) .Marland Kitchen... Take one by mouth daily    Hyzaar 100-25 Mg Tabs (Losartan potassium-hctz) .Marland Kitchen... 1 by mouth once daily  BP today: 144/76-- re check 130/80 Prior BP: 136/80 (12/07/2010)  Labs Reviewed: K+: 4.3 (01/02/2011) Creat: : 0.4 (01/02/2011)   Chol: 151 (01/02/2011)   HDL: 32.20 (01/02/2011)   LDL: 102 (01/02/2011)   TG: 85.0 (01/02/2011)  Problem # 2:  DIABETES MELLITUS, TYPE II (ICD-250.00) Assessment: Deteriorated  this is not optimally controlled - despite good fasting sugars disc fact that sugar has to be getting high at other times in the day... will check glucose two times a day  inc metformin to 500 two times a day - update if side eff or low sugar  stick to dm diet and exercise  f/u 3 mo after labs  Her updated medication list for this problem includes:    Glucotrol Xl 5 Mg Tb24 (Glipizide) .Marland Kitchen... Take one by mouth daily    Metformin Hcl 500 Mg Tabs (Metformin hcl) .Marland Kitchen... 1 by mouth two times a day    Hyzaar 100-25 Mg Tabs (Losartan potassium-hctz) .Marland Kitchen... 1 by mouth once daily  Orders: Prescription Created Electronically [phone])  Problem # 3:  HYPERCHOLESTEROLEMIA, PURE  (ICD-272.0) Assessment: Improved  this is much imp with caduet  rev labs  rev goal rev low sat fat diet   Her updated medication list for this problem includes:    Caduet 5-10 Mg Tabs (Amlodipine-atorvastatin) .Marland Kitchen... Take one by mouth daily  Labs Reviewed: SGOT: 22 (01/02/2011)   SGPT: 32 (01/02/2011)   HDL:32.20 (01/02/2011), 36.80 (09/28/2010)  LDL:102 (01/02/2011), 129 (07/24/2009)  Chol:151 (01/02/2011), 240 (09/28/2010)  Trig:85.0 (01/02/2011), 154.0 (09/28/2010)  Problem # 4:  OBESITY (ICD-278.00) Assessment: Unchanged rev need for wt loss diet and exercise are good now- but suspect she will need to cut her portions   Complete Medication List: 1)  Glucotrol Xl 5 Mg Tb24 (Glipizide) .... Take one by mouth daily 2)  Caduet 5-10 Mg Tabs (Amlodipine-atorvastatin) .... Take one by mouth daily 3)  Metformin Hcl 500 Mg Tabs (Metformin hcl) .Marland Kitchen.. 1 by mouth two times a day 4)  Zyrtec Allergy 10 Mg Tabs (Cetirizine hcl) .... Take one by mouth daily 5)  Flonase 50 Mcg/act Susp (Fluticasone propionate) .... 2 sp in each nostril once daily 6)  Freestyle Test Strp (Glucose blood) .... Check blood sugar as directed 7)  Proair Hfa 108 (90 Base) Mcg/act Aers (Albuterol sulfate) .... As needed 8)  Alvesco 160 Mcg/act Aers (Ciclesonide) .... One puff two times a day as needed 9)  Hyzaar 100-25 Mg Tabs (Losartan potassium-hctz) .Marland Kitchen.. 1 by mouth once daily 10)  Ketoconazole 2 % Crea (Ketoconazole) .... Apply to affected area once daily 11)  Caltrate 600+d 600-400 Mg-unit Chew (Calcium carbonate-vitamin d) .... Chews two daily  Patient Instructions: 1)  take metformin 500 mg one pill two times a day  2)  stick to diabetic diet - you may have to reduce portions for weight loss 3)  keep up the exercise- aim for at least 5 days per week  4)  if any side effects or low sugar let me know 5)  check sugar fasting in am and then 2 hours after biggest meal of the day  6)  schedule labs and f/u in 3 months  AIC and renal panel 250.0  Prescriptions: METFORMIN HCL 500 MG TABS (METFORMIN HCL) 1 by mouth two times a day  #60 x 11   Entered and Authorized by:   Judith Part MD   Signed by:   Judith Part MD on 01/09/2011   Method used:   Electron[facility] to        [facility] [phone]* (retail)       [address] [place], [place]  [address]       Ph: [phone] or [phone]       Fax: [phone]   RxID:   [id number]    Orders Added: 1)  Prescription Created Electronically [G8553] 2)  Est. Patient Level IV [36644]     Current Allergies (reviewed today): ! Pauline Aus

## 2011-02-18 ENCOUNTER — Encounter: Payer: Self-pay | Admitting: Family Medicine

## 2011-02-18 DIAGNOSIS — J45909 Unspecified asthma, uncomplicated: Secondary | ICD-10-CM | POA: Insufficient documentation

## 2011-02-18 DIAGNOSIS — IMO0001 Reserved for inherently not codable concepts without codable children: Secondary | ICD-10-CM

## 2011-02-18 DIAGNOSIS — E119 Type 2 diabetes mellitus without complications: Secondary | ICD-10-CM | POA: Insufficient documentation

## 2011-02-18 DIAGNOSIS — J309 Allergic rhinitis, unspecified: Secondary | ICD-10-CM | POA: Insufficient documentation

## 2011-02-18 DIAGNOSIS — J329 Chronic sinusitis, unspecified: Secondary | ICD-10-CM | POA: Insufficient documentation

## 2011-02-18 DIAGNOSIS — O1490 Unspecified pre-eclampsia, unspecified trimester: Secondary | ICD-10-CM | POA: Insufficient documentation

## 2011-03-29 ENCOUNTER — Other Ambulatory Visit: Payer: Self-pay | Admitting: *Deleted

## 2011-04-03 ENCOUNTER — Other Ambulatory Visit (INDEPENDENT_AMBULATORY_CARE_PROVIDER_SITE_OTHER): Payer: BC Managed Care – PPO | Admitting: Family Medicine

## 2011-04-03 DIAGNOSIS — E119 Type 2 diabetes mellitus without complications: Secondary | ICD-10-CM

## 2011-04-03 LAB — RENAL FUNCTION PANEL
Albumin: 3.9 g/dL (ref 3.5–5.2)
BUN: 9 mg/dL (ref 6–23)
Calcium: 9.1 mg/dL (ref 8.4–10.5)
Creatinine, Ser: 0.4 mg/dL (ref 0.4–1.2)
Glucose, Bld: 95 mg/dL (ref 70–99)
Sodium: 140 mEq/L (ref 135–145)

## 2011-04-10 ENCOUNTER — Encounter: Payer: Self-pay | Admitting: Family Medicine

## 2011-04-10 ENCOUNTER — Ambulatory Visit (INDEPENDENT_AMBULATORY_CARE_PROVIDER_SITE_OTHER): Payer: BC Managed Care – PPO | Admitting: Family Medicine

## 2011-04-10 DIAGNOSIS — E119 Type 2 diabetes mellitus without complications: Secondary | ICD-10-CM

## 2011-04-10 DIAGNOSIS — I10 Essential (primary) hypertension: Secondary | ICD-10-CM

## 2011-04-10 DIAGNOSIS — J45909 Unspecified asthma, uncomplicated: Secondary | ICD-10-CM

## 2011-04-10 DIAGNOSIS — E669 Obesity, unspecified: Secondary | ICD-10-CM

## 2011-04-10 MED ORDER — GLUCOSE BLOOD VI STRP: ORAL_STRIP | Status: AC

## 2011-04-10 NOTE — Patient Instructions (Signed)
I want to see some blood sugars from different times of day Am , before lunch, after lunch , after dinner , bedtime-- mix it up -- keep a log  You may have to buy a box of strips out of pocket for this  Continue current diet and exercise No change in medicine  Schedule fasting lab and follow up in about 3 months You are doing great

## 2011-04-10 NOTE — Assessment & Plan Note (Signed)
Getting under control with inhalers after pollen season  Did remind pt to use albuterol before exercise Nl exam today

## 2011-04-10 NOTE — Assessment & Plan Note (Signed)
Getting closer to goal with wt loss and diet and exercise Will check sugar more often - randomly for a month to notice trends  Lab and f/u 3 mo Good foot care

## 2011-04-10 NOTE — Assessment & Plan Note (Signed)
Better on 2nd check Some whitecoat component Continue the hyzaar  F/u 3 mo  Labs rev today

## 2011-04-10 NOTE — Progress Notes (Signed)
Subjective:    Patient ID: Katrina Smith, female    DOB: [birth year], 60 y.o.   MRN: [id number]  HPI Here for f/u of DM and HTN and obesity  Is doing pretty good Feeling good   Wt is down 8 lb- has really worked on it  Diet- using a dm meal replacement shake for lunch - that is convenient  Exercise -- still walking every day -- for at least 30 minutes  Is checking sugar - in ams is 82     DM-- a1c is down from 7.4 to 7.1 which is good  On glipizide and metformin  Glipizide in am 5 mg  Taking metformin 500 in the evening 1 pill  Does not check sugar during the day often  2 hours after dinner - up to 140   For breakfast oatmeal - plain --instant  luch is the meal repl shake-- with a serving of fruit  For dinner - soups - vegetable (not creamy) - and salad   Cannot afford strips  Needs accucheck compact plus strips    bp is 142/74 first check Known whitecoat htn in past  On hyzaar   Asthma is seasonal - worse earlier in the pollen season Getting better Inhaler as needed - esp for exercise   Past Medical History  Diagnosis Date  . Allergic rhinitis   . Asthma     mild  . Diabetes mellitus type II   . HTN, white coat   . Toxemia in pregnancy     x 2  . Chronic sinusitis     had nasal surgery   Past Surgical History  Procedure Date  . Cesarean section     x2  . Cholecystectomy 10/2004    gallstones  . Nasal sinus surgery 02/2008    reports that she has never smoked. She does not have any smokeless tobacco history on file. She reports that she does not drink alcohol. Her drug history not on file. family history includes Cancer in her brother; Dementia in her mother; Diabetes in her mother; Heart disease in her mother; Hypertension in her father; and Rheum arthritis in her brother. Allergies  Allergen Reactions  . Xopenex (Levalbuterol Hydrochloride) Other (See Comments)    Throat closed up.       Review of Systems  Review of Systems  Constitutional: Negative  for fever, appetite change, fatigue and unexpected weight change.  Eyes: Negative for pain and visual disturbance.  ENT: nasal congestion/ rhinorrhea  Respiratory: Negative for cough and shortness of breath.  , pos for wheezing occas  Cardiovascular: Negative.   Gastrointestinal: Negative for nausea, diarrhea and constipation.  Genitourinary: Negative for urgency and frequency.  Skin: Negative for pallor.  Neurological: Negative for weakness, light-headedness, numbness and headaches.  Hematological: Negative for adenopathy. Does not bruise/bleed easily.  Psychiatric/Behavioral: Negative for dysphoric mood. The patient is not nervous/anxious.          Objective:   Physical Exam  Constitutional: She appears well-developed and well-nourished.       overwt and well appearing   HENT:  Head: Normocephalic and atraumatic.  Right Ear: External ear normal.  Left Ear: External ear normal.  Mouth/Throat: Oropharynx is clear and moist.       Nares are boggy and pale   Eyes: Conjunctivae and EOM are normal. Pupils are equal, round, and reactive to light.  Neck: Normal range of motion. Neck supple. No JVD present. Carotid bruit is not present. No thyromegaly present.  Cardiovascular:  Normal rate, regular rhythm and normal heart sounds.   Pulmonary/Chest: Effort normal and breath sounds normal.       Scant wheeze on forced exp only  Abdominal: Soft. Bowel sounds are normal. She exhibits no distension and no mass. There is no tenderness.  Musculoskeletal: She exhibits no edema and no tenderness.  Lymphadenopathy:    She has no cervical adenopathy.  Neurological: She is alert. She has normal reflexes. Coordination normal.  Skin: Skin is warm and dry. No rash noted. No erythema. No pallor.  Psychiatric: She has a normal mood and affect.          Assessment & Plan:

## 2011-04-10 NOTE — Assessment & Plan Note (Signed)
Commended on wt loss so far with good habits

## 2011-07-03 ENCOUNTER — Other Ambulatory Visit (INDEPENDENT_AMBULATORY_CARE_PROVIDER_SITE_OTHER): Payer: BC Managed Care – PPO | Admitting: Family Medicine

## 2011-07-03 DIAGNOSIS — I10 Essential (primary) hypertension: Secondary | ICD-10-CM

## 2011-07-03 DIAGNOSIS — E119 Type 2 diabetes mellitus without complications: Secondary | ICD-10-CM

## 2011-07-03 LAB — HEMOGLOBIN A1C: Hgb A1c MFr Bld: 7.3 % — ABNORMAL HIGH (ref 4.6–6.5)

## 2011-07-03 LAB — RENAL FUNCTION PANEL
Albumin: 4.5 g/dL (ref 3.5–5.2)
Creatinine, Ser: 0.6 mg/dL (ref 0.4–1.2)
Glucose, Bld: 113 mg/dL — ABNORMAL HIGH (ref 70–99)
Phosphorus: 4 mg/dL (ref 2.3–4.6)
Potassium: 4.2 mEq/L (ref 3.5–5.1)
Sodium: 139 mEq/L (ref 135–145)

## 2011-07-03 LAB — LIPID PANEL
HDL: 41.4 mg/dL (ref >39.00)
LDL Cholesterol: 89 mg/dL (ref 0–99)
VLDL: 23.2 mg/dL (ref 0.0–40.0)

## 2011-07-05 ENCOUNTER — Other Ambulatory Visit: Payer: BC Managed Care – PPO

## 2011-07-10 ENCOUNTER — Ambulatory Visit: Payer: BC Managed Care – PPO | Admitting: Family Medicine

## 2011-08-02 ENCOUNTER — Ambulatory Visit: Payer: BC Managed Care – PPO | Admitting: Family Medicine

## 2011-08-14 ENCOUNTER — Ambulatory Visit: Payer: BC Managed Care – PPO | Admitting: Family Medicine

## 2011-09-04 ENCOUNTER — Ambulatory Visit: Payer: BC Managed Care – PPO | Admitting: Family Medicine

## 2011-09-30 ENCOUNTER — Telehealth: Payer: Self-pay | Admitting: *Deleted

## 2011-09-30 NOTE — Telephone Encounter (Signed)
Left message for pt to call back  °

## 2011-09-30 NOTE — Telephone Encounter (Signed)
Yes, please sched lab fasting for a1c and lipid prof and cmet and then f/u with me 401.1, 272 and 250.0 (HTN and DM and lipid) Thanks

## 2011-09-30 NOTE — Telephone Encounter (Signed)
Pt was to have come in in august to follow up with lab work that she had in july.  She has been unable to come in for the follow up due to her work schedule.  She says it's now time to repeat the lab work and she is asking if ok to schedule the lab appt and then have a follow up with you.  Please advise.  Please leave message on her home answering machine.

## 2011-10-01 ENCOUNTER — Other Ambulatory Visit: Payer: Self-pay | Admitting: Family Medicine

## 2011-10-01 DIAGNOSIS — E7800 Pure hypercholesterolemia, unspecified: Secondary | ICD-10-CM

## 2011-10-01 DIAGNOSIS — I10 Essential (primary) hypertension: Secondary | ICD-10-CM

## 2011-10-01 NOTE — Telephone Encounter (Signed)
Patient notified as instructed by telephone. Pt scheduled appt with Dr Milinda Antis 10/23/11 at 9am. And Fasting Lab appt scheduled as instructed 10/16/11.

## 2011-10-02 ENCOUNTER — Ambulatory Visit: Payer: BC Managed Care – PPO | Admitting: Family Medicine

## 2011-10-03 ENCOUNTER — Other Ambulatory Visit: Payer: Self-pay | Admitting: *Deleted

## 2011-10-03 MED ORDER — AMLODIPINE-ATORVASTATIN 5-10 MG PO TABS: 1.0000 | ORAL_TABLET | Freq: Every day | ORAL | Status: DC

## 2011-10-16 ENCOUNTER — Other Ambulatory Visit (INDEPENDENT_AMBULATORY_CARE_PROVIDER_SITE_OTHER): Payer: BC Managed Care – PPO

## 2011-10-16 DIAGNOSIS — I10 Essential (primary) hypertension: Secondary | ICD-10-CM

## 2011-10-16 DIAGNOSIS — E119 Type 2 diabetes mellitus without complications: Secondary | ICD-10-CM

## 2011-10-16 DIAGNOSIS — E7800 Pure hypercholesterolemia, unspecified: Secondary | ICD-10-CM

## 2011-10-16 LAB — COMPREHENSIVE METABOLIC PANEL
ALT: 26 U/L (ref 0–35)
AST: 16 U/L (ref 0–37)
Albumin: 4.1 g/dL (ref 3.5–5.2)
CO2: 28 mEq/L (ref 19–32)
Calcium: 9.1 mg/dL (ref 8.4–10.5)
Chloride: 102 mEq/L (ref 96–112)
Creatinine, Ser: 0.5 mg/dL (ref 0.4–1.2)
GFR: 134.03 mL/min (ref >60.00)
Potassium: 4 mEq/L (ref 3.5–5.1)
Total Protein: 7.2 g/dL (ref 6.0–8.3)

## 2011-10-16 LAB — LIPID PANEL
Total CHOL/HDL Ratio: 4
Triglycerides: 127 mg/dL (ref 0.0–149.0)

## 2011-10-23 ENCOUNTER — Ambulatory Visit: Payer: BC Managed Care – PPO | Admitting: Family Medicine

## 2011-10-30 ENCOUNTER — Ambulatory Visit (INDEPENDENT_AMBULATORY_CARE_PROVIDER_SITE_OTHER): Payer: BC Managed Care – PPO | Admitting: Family Medicine

## 2011-10-30 ENCOUNTER — Encounter: Payer: Self-pay | Admitting: Family Medicine

## 2011-10-30 VITALS — BP 128/78 | HR 96 | Temp 97.9°F | Ht 64.25 in | Wt 192.5 lb

## 2011-10-30 DIAGNOSIS — I10 Essential (primary) hypertension: Secondary | ICD-10-CM

## 2011-10-30 DIAGNOSIS — E7800 Pure hypercholesterolemia, unspecified: Secondary | ICD-10-CM

## 2011-10-30 DIAGNOSIS — E119 Type 2 diabetes mellitus without complications: Secondary | ICD-10-CM

## 2011-10-30 DIAGNOSIS — Z23 Encounter for immunization: Secondary | ICD-10-CM

## 2011-10-30 NOTE — Assessment & Plan Note (Signed)
Slowly improving  Good home sugars checking bid - very few above 120 Rev low glycemic diet  On ARB Will make own opthy appt Will start exercise  Rev foot care  F/u 6 mo  Lab Results  Component Value Date   HGBA1C 7.2* 10/16/2011   flu shot today  Will get pneumovax next time

## 2011-10-30 NOTE — Assessment & Plan Note (Signed)
bp in fair control at this time  No changes needed  Disc lifstyle change with low sodium diet and exercise   On ARB for renal protection as well  F/u 6 mo

## 2011-10-30 NOTE — Assessment & Plan Note (Signed)
Disc goals for lipids and reasons to control them Rev labs with pt Rev low sat fat diet in detail Good control with caduet  Enc to exercise

## 2011-10-30 NOTE — Patient Instructions (Signed)
Flu shot today  Keep up the good diet and weight loss effort  Exercise 5 days per week for 30 minutes  Don't forget to schedule your annual eye exam  Follow up in 6 months with labs prior

## 2011-10-30 NOTE — Progress Notes (Signed)
Subjective:    Patient ID: Katrina Smith, female    DOB: 1951/11/13, 60 y.o.   MRN: [id number]  HPI Here for f/u of HTN and DM and lipids  Is feeling good in general   128/78 bp today- HTN is in good control Wt is up 3 lb with bmi of 32 No cp or palp or ha No change in med Pulse sometimes high - ? Due to coffee    DM - sugar 109 fasting with a1c 7.2 down from 7.3- gradually coming down  On glipizide and metformin  Checking sugar bid - and most sugars are below 120  Has an occasional spike - to 160 Has a graph to look at  ? How sugar is in middle of the night  Disc lifestyle habits last visit Has not been exercising as much - though did join the [employer] (working more)  Takes care of her mother with dementia  Walks the dog every night - outside exercise makes her wheeze in the fall  Has some zumba videos Is really trying to eat healthy opthy - has been a year- she will set that up  On ARB  Lipids well controlled with caduet and diet  Lab Results  Component Value Date   CHOL 156 10/16/2011   CHOL 154 07/03/2011   CHOL 151 01/02/2011   Lab Results  Component Value Date   HDL 42.20 10/16/2011   HDL 41.40 07/03/2011   HDL 82.95* 01/02/2011   Lab Results  Component Value Date   LDLCALC 88 10/16/2011   LDLCALC 89 07/03/2011   LDLCALC 102* 01/02/2011   Lab Results  Component Value Date   TRIG 127.0 10/16/2011   TRIG 116.0 07/03/2011   TRIG 85.0 01/02/2011   Lab Results  Component Value Date   CHOLHDL 4 10/16/2011   CHOLHDL 4 07/03/2011   CHOLHDL 5 01/02/2011   Lab Results  Component Value Date   LDLDIRECT 166.9 09/28/2010    Flu shot --is required to get this for work  Has a very slight allergy to chicken -- and does eat chicken regularly and eggs and is fine  Has seen Dr Jenne Campus and then Dr Ruby Cola- said it would be ok  Pneumovax -? If she has had   Patient Active Problem List  Diagnoses  . DM w/o Complication Type II  . HYPERCHOLESTEROLEMIA, PURE  . OBESITY  .  HYPERTENSION  . ALLERGIC RHINITIS  . ASTHMA  . MOTION SICKNESS  . Allergic rhinitis  . Toxemia in pregnancy  . Chronic sinusitis   Past Medical History  Diagnosis Date  . Allergic rhinitis   . Asthma     mild  . Diabetes mellitus type II   . HTN, white coat   . Toxemia in pregnancy     x 2  . Chronic sinusitis     had nasal surgery   Past Surgical History  Procedure Date  . Cesarean section     x2  . Cholecystectomy 10/2004    gallstones  . Nasal sinus surgery 02/2008   History  Substance Use Topics  . Smoking status: Never Smoker   . Smokeless tobacco: Not on file  . Alcohol Use: No   Family History  Problem Relation Age of Onset  . Hypertension Father   . Diabetes Mother   . Dementia Mother   . Heart disease Mother   . Rheum arthritis Brother   . Cancer Brother     kidney   Allergies  Allergen Reactions  . Xopenex (Levalbuterol Hydrochloride) Other (See Comments)    Throat closed up.   Current Outpatient Prescriptions on File Prior to Visit  Medication Sig Dispense Refill  . amLODipine-atorvastatin (CADUET) 5-10 MG per tablet Take 1 tablet by mouth daily.  30 tablet  0  . Calcium Carbonate-Vitamin D (CALTRATE 600+D) 600-400 MG-UNIT per chew tablet Chew 2 tablets by mouth daily.        . cetirizine (ZYRTEC) 10 MG tablet Take 10 mg by mouth daily.        . ciclesonide (ALVESCO) 160 MCG/ACT inhaler Inhale 1 puff into the lungs 2 (two) times daily as needed.       . fluticasone (FLONASE) 50 MCG/ACT nasal spray 2 sprays by Nasal route daily.        Marland Kitchen glipiZIDE (GLUCOTROL) 5 MG 24 hr tablet Take 5 mg by mouth daily.        Marland Kitchen glucose blood (ACCU-CHEK COMPACT TEST DRUM) test strip Use as instructed  100 each  12  . ketoconazole (NIZORAL) 2 % cream Apply 1 application topically daily.        Marland Kitchen losartan-hydrochlorothiazide (HYZAAR) 100-25 MG per tablet Take 1 tablet by mouth daily.        . metFORMIN (GLUCOPHAGE) 500 MG tablet Take 500 mg by mouth daily.       .  Omega-3 Fatty Acids (FISH OIL CONCENTRATE PO) Take 1 capsule by mouth 2 (two) times daily.        Marland Kitchen albuterol (PROAIR HFA) 108 (90 BASE) MCG/ACT inhaler Inhale 2 puffs into the lungs every 6 (six) hours as needed.        Marland Kitchen glucose blood test strip 1 each by Other route as directed. Use as instructed           Review of Systems Review of Systems  Constitutional: Negative for fever, appetite change, fatigue and unexpected weight change.  Eyes: Negative for pain and visual disturbance.  ENT pos for chronic congestion from allergies  Respiratory: Negative for cough and shortness of breath.   Cardiovascular: Negative for cp or palpitations    Gastrointestinal: Negative for nausea, diarrhea and constipation.  Genitourinary: Negative for urgency and frequency.  Skin: Negative for pallor or rash   Neurological: Negative for weakness, light-headedness, numbness and headaches.  Hematological: Negative for adenopathy. Does not bruise/bleed easily.  Psychiatric/Behavioral: Negative for dysphoric mood. The patient is not nervous/anxious.          Objective:   Physical Exam  Constitutional: She appears well-developed and well-nourished. No distress.       overwt and well appearing   HENT:  Head: Normocephalic and atraumatic.  Mouth/Throat: Oropharynx is clear and moist.       Nares are boggy and congested   Eyes: Conjunctivae and EOM are normal. Pupils are equal, round, and reactive to light. No scleral icterus.  Neck: Normal range of motion. Neck supple. No JVD present. Carotid bruit is not present. No thyromegaly present.  Cardiovascular: Normal rate, regular rhythm, normal heart sounds and intact distal pulses.  Exam reveals no gallop.   Pulmonary/Chest: Effort normal and breath sounds normal. No respiratory distress. She has no wheezes. She exhibits no tenderness.  Abdominal: Soft. Bowel sounds are normal. She exhibits no distension, no abdominal bruit and no mass. There is no tenderness.    Musculoskeletal: Normal range of motion. She exhibits no edema and no tenderness.  Lymphadenopathy:    She has no cervical adenopathy.  Neurological: She  is alert. She has normal reflexes. No cranial nerve deficit. She exhibits normal muscle tone. Coordination abnormal.  Skin: Skin is warm and dry. No rash noted. No erythema. No pallor.  Psychiatric: She has a normal mood and affect.          Assessment & Plan:

## 2011-11-18 ENCOUNTER — Other Ambulatory Visit: Payer: Self-pay | Admitting: Family Medicine

## 2011-12-25 ENCOUNTER — Other Ambulatory Visit: Payer: Self-pay | Admitting: Family Medicine

## 2011-12-26 NOTE — Telephone Encounter (Signed)
[facility] [address] request refill Caduet 5-10 mg #30 x 11.

## 2012-02-18 ENCOUNTER — Other Ambulatory Visit: Payer: Self-pay | Admitting: Family Medicine

## 2012-03-26 ENCOUNTER — Other Ambulatory Visit: Payer: Self-pay | Admitting: Allergy and Immunology

## 2012-03-26 ENCOUNTER — Ambulatory Visit
Admission: RE | Admit: 2012-03-26 | Discharge: 2012-03-26 | Disposition: A | Discharge status: Home / Self Care | Payer: BC Managed Care – PPO | Source: Ambulatory Visit | Attending: Allergy and Immunology | Admitting: Allergy and Immunology

## 2012-03-26 DIAGNOSIS — R05 Cough: Secondary | ICD-10-CM

## 2012-04-22 ENCOUNTER — Other Ambulatory Visit: Payer: BC Managed Care – PPO

## 2012-04-29 ENCOUNTER — Ambulatory Visit: Payer: BC Managed Care – PPO | Admitting: Family Medicine

## 2012-05-21 ENCOUNTER — Other Ambulatory Visit: Payer: Self-pay

## 2012-05-27 ENCOUNTER — Ambulatory Visit: Payer: Self-pay | Admitting: Family Medicine

## 2012-05-28 ENCOUNTER — Other Ambulatory Visit (INDEPENDENT_AMBULATORY_CARE_PROVIDER_SITE_OTHER): Payer: BC Managed Care – PPO

## 2012-05-28 DIAGNOSIS — E119 Type 2 diabetes mellitus without complications: Secondary | ICD-10-CM

## 2012-06-03 ENCOUNTER — Encounter: Payer: Self-pay | Admitting: Family Medicine

## 2012-06-03 ENCOUNTER — Ambulatory Visit (INDEPENDENT_AMBULATORY_CARE_PROVIDER_SITE_OTHER): Payer: BC Managed Care – PPO | Admitting: Family Medicine

## 2012-06-03 VITALS — BP 124/78 | HR 110 | Temp 97.9°F | Ht 65.0 in | Wt 195.2 lb

## 2012-06-03 DIAGNOSIS — Z23 Encounter for immunization: Secondary | ICD-10-CM

## 2012-06-03 DIAGNOSIS — E119 Type 2 diabetes mellitus without complications: Secondary | ICD-10-CM

## 2012-06-03 DIAGNOSIS — I10 Essential (primary) hypertension: Secondary | ICD-10-CM

## 2012-06-03 NOTE — Assessment & Plan Note (Signed)
bp in fair control at this time  No changes needed  Disc lifstyle change with low sodium diet and exercise   

## 2012-06-03 NOTE — Progress Notes (Signed)
Subjective:    Patient ID: Katrina Smith, female    DOB: 01/12/1951, 61 y.o.   MRN: [id number]  HPI Here for f/u of chronic conditions Doing pretty well   Is dissapointed in wt -- has been unable to go to the gym , taking care of mother with severe alzheimers  Is very stressful    bp is     Today No cp or palpitations or headaches or edema  No side effects to medicines  124/78   Diabetes Home sugar results - are very good in the am , does not check in the afternoon DM diet - very good  Exercise -- not so good now  Symptoms A1C last is 7.1 down from 7.2 No problems with medications - glipizide and metformin  Renal protection- on ARB Last eye exam   Wt is up 3 lb with bmi of 32 Eating well but no time for exercise   Patient Active Problem List  Diagnosis  . DM w/o Complication Type II  . HYPERCHOLESTEROLEMIA, PURE  . OBESITY  . HYPERTENSION  . ALLERGIC RHINITIS  . ASTHMA  . MOTION SICKNESS  . Allergic rhinitis  . Toxemia in pregnancy  . Chronic sinusitis   Past Medical History  Diagnosis Date  . Allergic rhinitis   . Asthma     mild  . Diabetes mellitus type II   . HTN, white coat   . Toxemia in pregnancy     x 2  . Chronic sinusitis     had nasal surgery   Past Surgical History  Procedure Date  . Cesarean section     x2  . Cholecystectomy 10/2004    gallstones  . Nasal sinus surgery 02/2008   History  Substance Use Topics  . Smoking status: Never Smoker   . Smokeless tobacco: Not on file  . Alcohol Use: No   Family History  Problem Relation Age of Onset  . Hypertension Father   . Diabetes Mother   . Dementia Mother   . Heart disease Mother   . Rheum arthritis Brother   . Cancer Brother     kidney   Allergies  Allergen Reactions  . Xopenex (Levalbuterol Hydrochloride) Other (See Comments)    Throat closed up.   Current Outpatient Prescriptions on File Prior to Visit  Medication Sig Dispense Refill  . albuterol (PROAIR HFA) 108 (90 BASE)  MCG/ACT inhaler Inhale 2 puffs into the lungs every 6 (six) hours as needed.        Marland Kitchen amLODipine-atorvastatin (CADUET) 5-10 MG per tablet TAKE 1 TABLET BY MOUTH DAILY.  30 tablet  11  . aspirin EC 81 MG tablet Take 81 mg by mouth daily.        . Calcium Carbonate-Vitamin D (CALTRATE 600+D) 600-400 MG-UNIT per chew tablet Chew 2 tablets by mouth daily.        . cetirizine (ZYRTEC) 10 MG tablet Take 10 mg by mouth daily.        . ciclesonide (ALVESCO) 160 MCG/ACT inhaler Inhale 1 puff into the lungs 2 (two) times daily as needed.       . fluticasone (FLONASE) 50 MCG/ACT nasal spray 2 sprays by Nasal route daily.        Marland Kitchen glipiZIDE (GLUCOTROL XL) 5 MG 24 hr tablet TAKE ONE BY MOUTH DAILY  30 tablet  11  . glucose blood test strip 1 each by Other route as directed. Use as instructed       . ketoconazole (  NIZORAL) 2 % cream Apply 1 application topically daily.        Marland Kitchen losartan-hydrochlorothiazide (HYZAAR) 100-25 MG per tablet TAKE 1 TABLET BY MOUTH DAILY  30 tablet  11  . metFORMIN (GLUCOPHAGE) 500 MG tablet TAKE 1 TABLET TWICE A DAY  60 tablet  6  . montelukast (SINGULAIR) 10 MG tablet Take 10 mg by mouth at bedtime.      . naphazoline-pheniramine (NAPHCON-A) 0.025-0.3 % ophthalmic solution Place 1 drop into both eyes 4 (four) times daily as needed.        . Omega-3 Fatty Acids (FISH OIL CONCENTRATE PO) Take 1 capsule by mouth 2 (two) times daily.               Review of Systems Review of Systems  Constitutional: Negative for fever, appetite change, fatigue and unexpected weight change.  Eyes: Negative for pain and visual disturbance.  Respiratory: Negative for cough and shortness of breath.   Cardiovascular: Negative for cp or palpitations    Gastrointestinal: Negative for nausea, diarrhea and constipation.  Genitourinary: Negative for urgency and frequency.  Skin: Negative for pallor or rash   Neurological: Negative for weakness, light-headedness, numbness and headaches.  Hematological:  Negative for adenopathy. Does not bruise/bleed easily.  Psychiatric/Behavioral: Negative for dysphoric mood. The patient is not nervous/anxious. Lots of stress         Objective:   Physical Exam  Constitutional: She appears well-developed and well-nourished. No distress.       Obese and well appearing   HENT:  Head: Normocephalic and atraumatic.  Mouth/Throat: Oropharynx is clear and moist.  Eyes: Conjunctivae and EOM are normal. Pupils are equal, round, and reactive to light. No scleral icterus.  Neck: Neck supple. No JVD present. Carotid bruit is not present. No thyromegaly present.  Cardiovascular: Normal rate, regular rhythm, normal heart sounds and intact distal pulses.  Exam reveals no gallop.   Pulmonary/Chest: Effort normal and breath sounds normal. No respiratory distress. She has no wheezes.  Abdominal: Soft. Bowel sounds are normal. She exhibits no distension, no abdominal bruit and no mass. There is no tenderness.  Musculoskeletal: She exhibits no edema.  Lymphadenopathy:    She has no cervical adenopathy.  Neurological: She is alert. She has normal reflexes. No cranial nerve deficit. She exhibits normal muscle tone. Coordination normal.  Skin: Skin is warm and dry. No rash noted. No erythema.  Psychiatric: She has a normal mood and affect.          Assessment & Plan:

## 2012-06-03 NOTE — Patient Instructions (Addendum)
Please start checking some sugars 2 hours after a meal (instead of just am)  Follow DM diet  Also try to get some help to allow you to exercise  Take care of yourself  Schedule follow up in  Mo with labs prior  Pneumovax today

## 2012-06-03 NOTE — Assessment & Plan Note (Signed)
Stable despite inability to exercise (caring for mother) Good diet Enc to check some afternoon sugars Want a1c down to 7 or below Re check 3 mo  Pneumovax today

## 2012-06-24 ENCOUNTER — Other Ambulatory Visit: Payer: Self-pay | Admitting: *Deleted

## 2012-06-24 MED ORDER — GLUCOSE BLOOD VI STRP: ORAL_STRIP | Status: DC

## 2012-06-26 ENCOUNTER — Other Ambulatory Visit: Payer: Self-pay | Admitting: *Deleted

## 2012-06-26 MED ORDER — GLUCOSE BLOOD VI STRP: ORAL_STRIP | Status: DC

## 2012-09-01 ENCOUNTER — Telehealth: Payer: Self-pay | Admitting: Family Medicine

## 2012-09-01 DIAGNOSIS — E7800 Pure hypercholesterolemia, unspecified: Secondary | ICD-10-CM

## 2012-09-01 DIAGNOSIS — E119 Type 2 diabetes mellitus without complications: Secondary | ICD-10-CM

## 2012-09-01 DIAGNOSIS — Z0000 Encounter for general adult medical examination without abnormal findings: Secondary | ICD-10-CM | POA: Insufficient documentation

## 2012-09-01 NOTE — Telephone Encounter (Signed)
Message copied by Judy Pimple on Tue Sep 01, 2012  8:53 PM ------      Message from: Alvina Chou      Created: Tue Sep 01, 2012  4:47 PM      Regarding: Lab orders for [facility] 9.18.13       Fasting labs

## 2012-09-02 ENCOUNTER — Telehealth: Payer: Self-pay | Admitting: Family Medicine

## 2012-09-02 NOTE — Telephone Encounter (Signed)
I just looked and there are future labs for her already in the computer

## 2012-09-02 NOTE — Telephone Encounter (Signed)
Message copied by Judy Pimple on Wed Sep 02, 2012 10:08 PM ------      Message from: Baldomero Lamy      Created: Wed Aug 26, 2012  1:57 PM      Regarding: f/u  labs Thurs 9/19       Please order  future f/u labs for pt's upcomming lab appt.      Thanks      Rodney Booze

## 2012-09-03 ENCOUNTER — Other Ambulatory Visit: Payer: BC Managed Care – PPO

## 2012-09-11 ENCOUNTER — Ambulatory Visit: Payer: BC Managed Care – PPO | Admitting: Family Medicine

## 2012-09-17 ENCOUNTER — Other Ambulatory Visit (INDEPENDENT_AMBULATORY_CARE_PROVIDER_SITE_OTHER): Payer: BC Managed Care – PPO

## 2012-09-17 DIAGNOSIS — E119 Type 2 diabetes mellitus without complications: Secondary | ICD-10-CM

## 2012-09-17 DIAGNOSIS — E7800 Pure hypercholesterolemia, unspecified: Secondary | ICD-10-CM

## 2012-09-17 DIAGNOSIS — Z0000 Encounter for general adult medical examination without abnormal findings: Secondary | ICD-10-CM

## 2012-09-17 LAB — COMPREHENSIVE METABOLIC PANEL
ALT: 35 U/L (ref 0–35)
AST: 22 U/L (ref 0–37)
Albumin: 4 g/dL (ref 3.5–5.2)
Alkaline Phosphatase: 81 U/L (ref 39–117)
BUN: 8 mg/dL (ref 6–23)
Creatinine, Ser: 0.6 mg/dL (ref 0.4–1.2)
Potassium: 4.3 mEq/L (ref 3.5–5.1)

## 2012-09-17 LAB — CBC WITH DIFFERENTIAL/PLATELET
Basophils Relative: 0.6 % (ref 0.0–3.0)
Eosinophils Absolute: 0.8 10*3/uL — ABNORMAL HIGH (ref 0.0–0.7)
Hemoglobin: 13 g/dL (ref 12.0–15.0)
Lymphs Abs: 4 10*3/uL (ref 0.7–4.0)
MCHC: 32.7 g/dL (ref 30.0–36.0)
MCV: 92.4 fl (ref 78.0–100.0)
Monocytes Absolute: 0.7 10*3/uL (ref 0.1–1.0)
Neutro Abs: 5.3 10*3/uL (ref 1.4–7.7)
RBC: 4.31 Mil/uL (ref 3.87–5.11)

## 2012-09-17 LAB — LIPID PANEL
LDL Cholesterol: 103 mg/dL — ABNORMAL HIGH (ref 0–99)
Total CHOL/HDL Ratio: 4

## 2012-09-22 ENCOUNTER — Encounter: Payer: Self-pay | Admitting: Family Medicine

## 2012-09-25 ENCOUNTER — Encounter: Payer: Self-pay | Admitting: Family Medicine

## 2012-09-25 ENCOUNTER — Ambulatory Visit (INDEPENDENT_AMBULATORY_CARE_PROVIDER_SITE_OTHER): Payer: BC Managed Care – PPO | Admitting: Family Medicine

## 2012-09-25 VITALS — BP 134/84 | HR 100 | Temp 98.3°F | Ht 65.0 in | Wt 196.8 lb

## 2012-09-25 DIAGNOSIS — T148XXA Other injury of unspecified body region, initial encounter: Secondary | ICD-10-CM

## 2012-09-25 DIAGNOSIS — E119 Type 2 diabetes mellitus without complications: Secondary | ICD-10-CM

## 2012-09-25 DIAGNOSIS — I10 Essential (primary) hypertension: Secondary | ICD-10-CM

## 2012-09-25 DIAGNOSIS — Z23 Encounter for immunization: Secondary | ICD-10-CM

## 2012-09-25 DIAGNOSIS — Z1231 Encounter for screening mammogram for malignant neoplasm of breast: Secondary | ICD-10-CM

## 2012-09-25 NOTE — Assessment & Plan Note (Signed)
Blood blister on L back 2-3 mm Expressed and bandaged If this re occurs will need to see derm

## 2012-09-25 NOTE — Progress Notes (Signed)
Subjective:    Patient ID: Katrina Smith, female    DOB: 08/31/1951, 61 y.o.   MRN: [id number]  HPI Here for f/u of DM  Is doing better Has made effort to start walking - proud of   bp is stable today  No cp or palpitations or headaches or edema  No side effects to medicines  BP Readings from Last 3 Encounters:  09/25/12 134/84  06/03/12 124/78  10/30/11 128/78     Wt is up 1 lb with bmi  Of 32  Diabetes Home sugar results - good overall - after lunch will go up to 135-140 , and in am are 120s or below  DM diet - is going fine -is eating very healthy - oatmeal in the am and [place] sandwhich at lunch  Lots of green vegetables , and stared eating kale  Has been to DM teaching  Exercise - that is better  Symptoms-none  A1C last  Lab Results  Component Value Date   HGBA1C 7.5* 09/17/2012    No problems with medications  Renal protection-on ARB  Last eye exam   Bought some garcinia cambogia Has some chromium in it  Supposed to dec appetite and also give energy   Cannot afford colonosc right now    Patient Active Problem List  Diagnosis  . Type II or unspecified type diabetes mellitus without mention of complication, not stated as uncontrolled  . HYPERCHOLESTEROLEMIA, PURE  . OBESITY  . HYPERTENSION  . ALLERGIC RHINITIS  . ASTHMA  . MOTION SICKNESS  . Allergic rhinitis  . Toxemia in pregnancy  . Chronic sinusitis  . Routine general medical examination at a health care facility   Past Medical History  Diagnosis Date  . Allergic rhinitis   . Asthma     mild  . Diabetes mellitus type II   . HTN, white coat   . Toxemia in pregnancy     x 2  . Chronic sinusitis     had nasal surgery   Past Surgical History  Procedure Date  . Cesarean section     x2  . Cholecystectomy 10/2004    gallstones  . Nasal sinus surgery 02/2008   History  Substance Use Topics  . Smoking status: Never Smoker   . Smokeless tobacco: Not on file  . Alcohol Use: No   Family  History  Problem Relation Age of Onset  . Hypertension Father   . Diabetes Mother   . Dementia Mother   . Heart disease Mother   . Rheum arthritis Brother   . Cancer Brother     kidney   Allergies  Allergen Reactions  . Xopenex (Levalbuterol Hydrochloride) Other (See Comments)    Throat closed up.   Current Outpatient Prescriptions on File Prior to Visit  Medication Sig Dispense Refill  . albuterol (PROAIR HFA) 108 (90 BASE) MCG/ACT inhaler Inhale 2 puffs into the lungs every 6 (six) hours as needed.        Marland Kitchen amLODipine-atorvastatin (CADUET) 5-10 MG per tablet TAKE 1 TABLET BY MOUTH DAILY.  30 tablet  11  . aspirin EC 81 MG tablet Take 81 mg by mouth daily.        . Calcium Carbonate-Vitamin D (CALTRATE 600+D) 600-400 MG-UNIT per chew tablet Chew 2 tablets by mouth daily.        . cetirizine (ZYRTEC) 10 MG tablet Take 10 mg by mouth daily.        . ciclesonide (ALVESCO) 160 MCG/ACT inhaler Inhale  1 puff into the lungs 2 (two) times daily as needed.       . fluticasone (FLONASE) 50 MCG/ACT nasal spray 2 sprays by Nasal route daily.        Marland Kitchen glipiZIDE (GLUCOTROL XL) 5 MG 24 hr tablet TAKE ONE BY MOUTH DAILY  30 tablet  11  . glucose blood (ACCU-CHEK COMPACT TEST DRUM) test strip Use as instructed  102 each  11  . glucose blood test strip 1 each by Other route as directed. Use as instructed       . ketoconazole (NIZORAL) 2 % cream Apply 1 application topically daily.        Marland Kitchen losartan-hydrochlorothiazide (HYZAAR) 100-25 MG per tablet TAKE 1 TABLET BY MOUTH DAILY  30 tablet  11  . metFORMIN (GLUCOPHAGE) 500 MG tablet TAKE 1 TABLET TWICE A DAY  60 tablet  6  . montelukast (SINGULAIR) 10 MG tablet Take 10 mg by mouth at bedtime.      . naphazoline-pheniramine (NAPHCON-A) 0.025-0.3 % ophthalmic solution Place 1 drop into both eyes 4 (four) times daily as needed.        . Omega-3 Fatty Acids (FISH OIL CONCENTRATE PO) Take 1 capsule by mouth 2 (two) times daily.              Review of  Systems Review of Systems  Constitutional: Negative for fever, appetite change, fatigue and unexpected weight change.  Eyes: Negative for pain and visual disturbance.  Respiratory: Negative for cough and shortness of breath.   Cardiovascular: Negative for cp or palpitations    Gastrointestinal: Negative for nausea, diarrhea and constipation.  Genitourinary: Negative for urgency and frequency. no excessive thirst  Skin: Negative for pallor or rash   Neurological: Negative for weakness, light-headedness, numbness and headaches.  Hematological: Negative for adenopathy. Does not bruise/bleed easily.  Psychiatric/Behavioral: Negative for dysphoric mood. The patient is not nervous/anxious.         Objective:   Physical Exam  Constitutional: She appears well-developed and well-nourished.       obese and well appearing   HENT:  Head: Normocephalic and atraumatic.  Mouth/Throat: Oropharynx is clear and moist.  Eyes: Conjunctivae normal and EOM are normal. Pupils are equal, round, and reactive to light. Right eye exhibits no discharge. Left eye exhibits no discharge. No scleral icterus.  Neck: Normal range of motion. Neck supple. No JVD present. Carotid bruit is not present. No thyromegaly present.  Cardiovascular: Normal rate, regular rhythm, normal heart sounds and intact distal pulses.  Exam reveals no gallop.   Pulmonary/Chest: Effort normal and breath sounds normal. No respiratory distress. She has no wheezes.  Abdominal: Soft. Bowel sounds are normal. She exhibits no distension, no abdominal bruit and no mass. There is no tenderness.  Musculoskeletal: Normal range of motion. She exhibits no edema and no tenderness.  Lymphadenopathy:    She has no cervical adenopathy.  Neurological: She is alert. She has normal reflexes. No cranial nerve deficit. She exhibits normal muscle tone. Coordination normal.  Skin: Skin is warm and dry. No rash noted. No erythema. No pallor.       Small 2-3 mm  lesion on L back under bra line - resembles a blister  This was expressed and blood/ clear fluid seen  Dressed with band aid and abx ointment with relief  Psychiatric: She has a normal mood and affect.          Assessment & Plan:

## 2012-09-25 NOTE — Assessment & Plan Note (Signed)
a1c 7.5- pt unaware she needs to take metformin bid Will update if problems Effort towards wt loss and exercise also F/u 3 mo

## 2012-09-25 NOTE — Assessment & Plan Note (Signed)
bp in fair control at this time  No changes needed  Disc lifstyle change with low sodium diet and exercise   Rev labs 

## 2012-09-25 NOTE — Patient Instructions (Addendum)
We will refer you for mammogram at check out  Flu imm today  Increase metformin to twice daily  Keep antibiotic ointment on blister on your back and if it reoccurs you need to see a dermatologist  Schedule annual exam with labs prior in 3 months (any 30 min visit)

## 2012-09-25 NOTE — Assessment & Plan Note (Signed)
Will schedule that F/u for PE in 3 mo

## 2012-10-07 ENCOUNTER — Ambulatory Visit: Payer: Self-pay | Admitting: Family Medicine

## 2012-10-13 ENCOUNTER — Encounter: Payer: Self-pay | Admitting: Family Medicine

## 2012-10-23 ENCOUNTER — Other Ambulatory Visit: Payer: Self-pay | Admitting: *Deleted

## 2012-10-23 MED ORDER — LOSARTAN POTASSIUM-HCTZ 100-25 MG PO TABS: 1.0000 | ORAL_TABLET | Freq: Every day | ORAL | Status: DC

## 2012-10-23 MED ORDER — GLIPIZIDE ER 5 MG PO TB24: 5.0000 mg | ORAL_TABLET | Freq: Every day | ORAL | Status: DC

## 2012-10-23 MED ORDER — METFORMIN HCL 500 MG PO TABS: 500.0000 mg | ORAL_TABLET | Freq: Two times a day (BID) | ORAL | Status: DC

## 2012-10-23 MED ORDER — MONTELUKAST SODIUM 10 MG PO TABS: 10.0000 mg | ORAL_TABLET | Freq: Every day | ORAL | Status: DC

## 2012-10-23 MED ORDER — GLUCOSE BLOOD VI STRP: ORAL_STRIP | Status: DC

## 2012-12-10 ENCOUNTER — Encounter: Payer: Self-pay | Admitting: Family Medicine

## 2012-12-10 ENCOUNTER — Ambulatory Visit (INDEPENDENT_AMBULATORY_CARE_PROVIDER_SITE_OTHER): Payer: BC Managed Care – PPO | Admitting: Family Medicine

## 2012-12-10 VITALS — BP 160/80 | HR 110 | Temp 98.6°F | Wt 192.0 lb

## 2012-12-10 DIAGNOSIS — J45909 Unspecified asthma, uncomplicated: Secondary | ICD-10-CM

## 2012-12-10 DIAGNOSIS — I10 Essential (primary) hypertension: Secondary | ICD-10-CM

## 2012-12-10 MED ORDER — GUAIFENESIN-CODEINE 100-10 MG/5ML PO SYRP: 5.0000 mL | ORAL_SOLUTION | Freq: Two times a day (BID) | ORAL | Status: DC | PRN

## 2012-12-10 MED ORDER — AZITHROMYCIN 250 MG PO TABS: ORAL_TABLET | ORAL | Status: DC

## 2012-12-10 MED ORDER — PREDNISONE 20 MG PO TABS: ORAL_TABLET | ORAL | Status: DC

## 2012-12-10 NOTE — Patient Instructions (Signed)
I think you have asthmatic bronchitis. Treat with prednisone course zpack to hold on to in case worsening productive cough, fever, or worsening instead of improving. Please let us know if not improved with this medicine. May continue cheratussin.

## 2012-12-10 NOTE — Assessment & Plan Note (Signed)
Discussed avoiding naphcon A if able Noted hypertensive and tachycardic today, attributed to ill visit.

## 2012-12-10 NOTE — Assessment & Plan Note (Signed)
Discussed dx - will continue to avoid albuterol given hx reaction to xopenex. Treat cough with continued cheratussin, asthma flare with oral prednisone, and zpack to hold on to in case worsening or sxs of bacterial infection. Pt agrees with plan. Red flags to return discussed. Good o2 sat, nontoxic.

## 2012-12-10 NOTE — Progress Notes (Signed)
  Subjective:    Patient ID: Katrina Smith, female    DOB: Apr 27, 1951, 61 y.o.   MRN: [id number]  HPI CC: cough  6 wk ago with cough and congestion.  [facility] - dx with bronchitis and sinusitis and treated with augmentin x 7 days which cleared sxs, as well as tessalon perls and cheratussin.  Never fully cleared.  Started coughing again on Tuesday - 2 d ago.  + PNdrainage.  + congestion.  Mostly dry cough.  + wheezing.  No fevers/chills, headaches, ST, chest pain, abd pain, ear or tooth pain.  Has tried nyquil for this.  Cares for child who has been sick. No smokers at home.   + h/o asthma.  + h/o allergic rhinitis.  Allergic to xopenex (throat closing) so avoids albuterol.  Am sugar today 85. Normally not too hypertensive, mild chronic tachycardia noted.  Past Medical History  Diagnosis Date  . Allergic rhinitis   . Asthma     mild  . Diabetes mellitus type II   . HTN, white coat   . Toxemia in pregnancy     x 2  . Chronic sinusitis     had nasal surgery     Review of Systems Per HPI    Objective:   Physical Exam  Nursing note and vitals reviewed. Constitutional: She appears well-developed and well-nourished. No distress.  HENT:  Head: Normocephalic and atraumatic.  Right Ear: Hearing, tympanic membrane, external ear and ear canal normal.  Left Ear: Hearing, tympanic membrane, external ear and ear canal normal.  Nose: No mucosal edema or rhinorrhea. Right sinus exhibits no maxillary sinus tenderness and no frontal sinus tenderness. Left sinus exhibits no maxillary sinus tenderness and no frontal sinus tenderness.  Mouth/Throat: Uvula is midline, oropharynx is clear and moist and mucous membranes are normal. No oropharyngeal exudate, posterior oropharyngeal edema, posterior oropharyngeal erythema or tonsillar abscesses.  Eyes: Conjunctivae normal and EOM are normal. Pupils are equal, round, and reactive to light. No scleral icterus.  Neck: Normal range of  motion. Neck supple.  Cardiovascular: Normal rate, regular rhythm, normal heart sounds and intact distal pulses.   No murmur heard. Pulmonary/Chest: Effort normal. No respiratory distress. She has wheezes. She has no rales.       Exp wheezing throughout, prolonged expiratory phase  Lymphadenopathy:    She has no cervical adenopathy.  Skin: Skin is warm and dry. No rash noted.          Assessment & Plan:

## 2012-12-11 ENCOUNTER — Telehealth: Payer: Self-pay | Admitting: Family Medicine

## 2012-12-11 NOTE — Telephone Encounter (Signed)
[facility] Triage Call Report Triage Record Num: [id number] Operator: Trey Paula Patient Name: Katrina Smith Call Date & Time: 12/10/2012 8:34:51PM Patient Phone: [phone] PCP: Eustaquio Boyden Patient Gender: Female PCP Fax : [phone] Patient DOB: November 14, 1951 Practice Name[facility] Reason for Call: Caller: [name]/Patient; PCP: Eustaquio Boyden [facility]); CB#: [phone]; Call regarding Medication not received at pharmacy. Was seen by Dr. Sharen Hones today 12/10/12 and was supposed to be called in Prednisone. Was not received at pharmacy. Per EPIC script [facility] to [facility] [address] Per patient request called in per EPIC Prednisone 20mg , #15, 2 pills a day for 5 days, then 1 pill a day for 5 days, no refills. [facility] [phone]. Advised patient she may pick medication up tonight. Verbalizes understanding. Protocol(s) Used: Medication Questions - Adult Recommended Outcome per Protocol: Provided Health Information Reason for Outcome: Caller has medication question(s) that was answered with available resources Care Advice: ~ 12/10/2012 8:54:48PM Page 1 of 1 [email or web address]

## 2012-12-11 NOTE — Telephone Encounter (Signed)
Noted. Thanks.

## 2012-12-13 ENCOUNTER — Encounter: Payer: Self-pay | Admitting: Family Medicine

## 2012-12-30 ENCOUNTER — Other Ambulatory Visit: Payer: BC Managed Care – PPO

## 2013-01-05 ENCOUNTER — Other Ambulatory Visit: Payer: Self-pay | Admitting: Family Medicine

## 2013-02-10 ENCOUNTER — Telehealth: Payer: Self-pay | Admitting: Family Medicine

## 2013-02-10 ENCOUNTER — Other Ambulatory Visit (INDEPENDENT_AMBULATORY_CARE_PROVIDER_SITE_OTHER): Payer: BC Managed Care – PPO

## 2013-02-10 DIAGNOSIS — E7800 Pure hypercholesterolemia, unspecified: Secondary | ICD-10-CM

## 2013-02-10 DIAGNOSIS — E119 Type 2 diabetes mellitus without complications: Secondary | ICD-10-CM

## 2013-02-10 DIAGNOSIS — Z0000 Encounter for general adult medical examination without abnormal findings: Secondary | ICD-10-CM

## 2013-02-10 LAB — CBC WITH DIFFERENTIAL/PLATELET
Basophils Relative: 0.6 % (ref 0.0–3.0)
Eosinophils Relative: 7.3 % — ABNORMAL HIGH (ref 0.0–5.0)
HCT: 38 % (ref 36.0–46.0)
Lymphs Abs: 3.7 10*3/uL (ref 0.7–4.0)
MCV: 90.1 fl (ref 78.0–100.0)
Monocytes Absolute: 0.6 10*3/uL (ref 0.1–1.0)
RBC: 4.21 Mil/uL (ref 3.87–5.11)
WBC: 10.1 10*3/uL (ref 4.5–10.5)

## 2013-02-10 LAB — LIPID PANEL
Cholesterol: 126 mg/dL (ref 0–200)
VLDL: 22.2 mg/dL (ref 0.0–40.0)

## 2013-02-10 LAB — COMPREHENSIVE METABOLIC PANEL
Albumin: 3.9 g/dL (ref 3.5–5.2)
Alkaline Phosphatase: 77 U/L (ref 39–117)
BUN: 8 mg/dL (ref 6–23)
CO2: 31 mEq/L (ref 19–32)
GFR: 119.53 mL/min (ref >60.00)
Glucose, Bld: 108 mg/dL — ABNORMAL HIGH (ref 70–99)
Potassium: 3.9 mEq/L (ref 3.5–5.1)
Total Bilirubin: 0.4 mg/dL (ref 0.3–1.2)

## 2013-02-10 LAB — HEMOGLOBIN A1C: Hgb A1c MFr Bld: 7.4 % — ABNORMAL HIGH (ref 4.6–6.5)

## 2013-02-10 NOTE — Telephone Encounter (Signed)
Lab order

## 2013-02-17 ENCOUNTER — Encounter: Payer: Self-pay | Admitting: Family Medicine

## 2013-02-17 ENCOUNTER — Ambulatory Visit (INDEPENDENT_AMBULATORY_CARE_PROVIDER_SITE_OTHER): Payer: BC Managed Care – PPO | Admitting: Family Medicine

## 2013-02-17 ENCOUNTER — Other Ambulatory Visit (HOSPITAL_COMMUNITY)
Admission: RE | Admit: 2013-02-17 | Discharge: 2013-02-17 | Disposition: A | Discharge status: Home / Self Care | Payer: BC Managed Care – PPO | Source: Ambulatory Visit | Attending: Family Medicine | Admitting: Family Medicine

## 2013-02-17 VITALS — BP 144/78 | HR 105 | Temp 98.9°F | Ht 65.0 in | Wt 192.2 lb

## 2013-02-17 DIAGNOSIS — Z01419 Encounter for gynecological examination (general) (routine) without abnormal findings: Secondary | ICD-10-CM

## 2013-02-17 DIAGNOSIS — Z1211 Encounter for screening for malignant neoplasm of colon: Secondary | ICD-10-CM | POA: Insufficient documentation

## 2013-02-17 DIAGNOSIS — Z0000 Encounter for general adult medical examination without abnormal findings: Secondary | ICD-10-CM

## 2013-02-17 DIAGNOSIS — I10 Essential (primary) hypertension: Secondary | ICD-10-CM

## 2013-02-17 DIAGNOSIS — E119 Type 2 diabetes mellitus without complications: Secondary | ICD-10-CM

## 2013-02-17 DIAGNOSIS — E669 Obesity, unspecified: Secondary | ICD-10-CM

## 2013-02-17 DIAGNOSIS — E7800 Pure hypercholesterolemia, unspecified: Secondary | ICD-10-CM

## 2013-02-17 DIAGNOSIS — Z1151 Encounter for screening for human papillomavirus (HPV): Secondary | ICD-10-CM | POA: Insufficient documentation

## 2013-02-17 MED ORDER — METFORMIN HCL 500 MG PO TABS: ORAL_TABLET | ORAL | Status: DC

## 2013-02-17 NOTE — Assessment & Plan Note (Signed)
Exam done with pap  Small introitus - somewhat difficult exam -but no abn seen

## 2013-02-17 NOTE — Patient Instructions (Addendum)
Please send for last mammogram - at [facility] - from this fall  Increase your metformin to 500 mg in am and 1000 mg at dinner time - let me know if any problems If your sugar gets too low - then let me know (we would likely get rid of the glipizide) Follow up in 3 months with labs prior Please do stool card for colon cancer screening  Work up to 5 days of exercise per week (indoors or out) If you are interested in a shingles/zoster vaccine - call your insurance to check on coverage,( you should not get it within 1 month of other vaccines) , then call us for a prescription  for it to take to a pharmacy that gives the shot , or make a nurse visit to get it here depending on your coverage

## 2013-02-17 NOTE — Progress Notes (Signed)
Subjective:    Patient ID: Katrina Smith, female    DOB: [birth year], 62 y.o.   MRN: [id number]  HPI Here for health maintenance exam and to review chronic medical problems    Feeling pretty good overall  Is generally stressed with caring for her mother    Wt  Is stable - bmi over 30 Has tried to eat healthier - and proud of that - cholesterol shows it   Colon cancer screen- has not had a colonoscopy-and not ready yet  Will do IFOB   Zoster status- would be interested in vaccine if it is covered   Lipids Lab Results  Component Value Date   CHOL 126 02/10/2013   CHOL 161 09/17/2012   CHOL 156 10/16/2011   Lab Results  Component Value Date   HDL 28.90* 02/10/2013   HDL 36.60* 09/17/2012   HDL 42.20 10/16/2011   Lab Results  Component Value Date   LDLCALC 75 02/10/2013   LDLCALC 103* 09/17/2012   LDLCALC 88 10/16/2011   Lab Results  Component Value Date   TRIG 111.0 02/10/2013   TRIG 105.0 09/17/2012   TRIG 127.0 10/16/2011   Lab Results  Component Value Date   CHOLHDL 4 02/10/2013   CHOLHDL 4 09/17/2012   CHOLHDL 4 10/16/2011   Lab Results  Component Value Date   LDLDIRECT 166.9 09/28/2010  not exercising  Walks when it is nice outside  She does have videos to exercise inside    DM2 She follows her sugars - thinks she may be spiking after dinner  Otherwise doing well and no hypoglycemia  Lab Results  Component Value Date   HGBA1C 7.4* 02/10/2013   this is down from7.5- overall stable  opthy- is due now for annual On ARB for renal protection  bp is stable today  No cp or palpitations or headaches or edema  No side effects to medicines  BP Readings from Last 3 Encounters:  02/17/13 144/78  12/10/12 160/80  09/25/12 134/84     mammo goes to [place] - was in the fall  Self exam-no lumps or changes   Pap 10/07- has not had a hysterectomy Needs exam today  Mood- despite stress does very well and not depressed   Patient Active Problem List  Diagnosis  .  Type II or unspecified type diabetes mellitus without mention of complication, not stated as uncontrolled  . HYPERCHOLESTEROLEMIA, PURE  . OBESITY  . HYPERTENSION  . ALLERGIC RHINITIS  . ASTHMA  . MOTION SICKNESS  . Allergic rhinitis  . Toxemia in pregnancy  . Chronic sinusitis  . Routine general medical examination at a health care facility  . Other screening mammogram  . Colon cancer screening  . Encounter for routine gynecological examination   Past Medical History  Diagnosis Date  . Allergic rhinitis   . Asthma     mild  . Diabetes mellitus type II   . HTN, white coat   . Toxemia in pregnancy     x 2  . Chronic sinusitis     had nasal surgery   Past Surgical History  Procedure Laterality Date  . Cesarean section      x2  . Cholecystectomy  10/2004    gallstones  . Nasal sinus surgery  02/2008   History  Substance Use Topics  . Smoking status: Never Smoker   . Smokeless tobacco: Not on file  . Alcohol Use: No   Family History  Problem Relation Age of Onset  .  Hypertension Father   . Diabetes Mother   . Dementia Mother   . Heart disease Mother   . Rheum arthritis Brother   . Cancer Brother     kidney   Allergies  Allergen Reactions  . Other     Poppy seeds  . Xopenex (Levalbuterol Hydrochloride) Other (See Comments)    Throat closed up.   Current Outpatient Prescriptions on File Prior to Visit  Medication Sig Dispense Refill  . albuterol (PROAIR HFA) 108 (90 BASE) MCG/ACT inhaler Inhale 2 puffs into the lungs every 6 (six) hours as needed.        Marland Kitchen amLODipine-atorvastatin (CADUET) 5-10 MG per tablet TAKE 1 TABLET BY MOUTH DAILY.  30 tablet  1  . aspirin EC 81 MG tablet Take 81 mg by mouth daily.        . Calcium Carbonate-Vitamin D (CALTRATE 600+D) 600-400 MG-UNIT per chew tablet Chew 2 tablets by mouth daily.        . cetirizine (ZYRTEC) 10 MG tablet Take 10 mg by mouth daily.       . ciclesonide (ALVESCO) 160 MCG/ACT inhaler Inhale 1 puff into the  lungs 2 (two) times daily as needed.       . fluticasone (FLONASE) 50 MCG/ACT nasal spray 2 sprays by Nasal route daily.        Marland Kitchen glipiZIDE (GLUCOTROL XL) 5 MG 24 hr tablet Take 1 tablet (5 mg total) by mouth daily.  90 tablet  1  . glucose blood (ACCU-CHEK COMPACT TEST DRUM) test strip Use as instructed  102 each  3  . glucose blood test strip 1 each by Other route as directed. Use as instructed       . ketoconazole (NIZORAL) 2 % cream Apply 1 application topically as needed.       Marland Kitchen losartan-hydrochlorothiazide (HYZAAR) 100-25 MG per tablet Take 1 tablet by mouth daily.  90 tablet  1  . montelukast (SINGULAIR) 10 MG tablet Take 1 tablet (10 mg total) by mouth at bedtime.  90 tablet  1  . naphazoline-pheniramine (NAPHCON-A) 0.025-0.3 % ophthalmic solution Place 1 drop into both eyes 4 (four) times daily as needed.        . Omega-3 Fatty Acids (FISH OIL CONCENTRATE PO) Take 1 capsule by mouth 2 (two) times daily.         No current facility-administered medications on file prior to visit.    Review of Systems     Objective:   Physical Exam  Constitutional: She appears well-developed and well-nourished. No distress.  obese and well appearing   HENT:  Head: Normocephalic and atraumatic.  Right Ear: External ear normal.  Left Ear: External ear normal.  Nose: Nose normal.  Mouth/Throat: Oropharynx is clear and moist.  Eyes: Conjunctivae and EOM are normal. Pupils are equal, round, and reactive to light. Right eye exhibits no discharge. Left eye exhibits no discharge. No scleral icterus.  Neck: Normal range of motion. Neck supple. No JVD present. Carotid bruit is not present. No thyromegaly present.  Cardiovascular: Normal rate, regular rhythm, normal heart sounds and intact distal pulses.  Exam reveals no gallop.   Pulmonary/Chest: Effort normal and breath sounds normal. No respiratory distress. She has no wheezes. She exhibits no tenderness.  Abdominal: Soft. Bowel sounds are normal. She  exhibits no distension, no abdominal bruit and no mass. There is no tenderness.  Genitourinary: Vagina normal and uterus normal. No breast swelling, tenderness, discharge or bleeding. There is no rash, tenderness  or lesion on the right labia. There is no rash, tenderness or lesion on the left labia. Uterus is not enlarged and not tender. Cervix exhibits no motion tenderness, no discharge and no friability. Right adnexum displays no mass, no tenderness and no fullness. Left adnexum displays no mass, no tenderness and no fullness. No bleeding around the vagina. No vaginal discharge found.  Breast exam: No mass, nodules, thickening, tenderness, bulging, retraction, inflamation, nipple discharge or skin changes noted.  No axillary or clavicular LA.  Chaperoned exam.   Very small introitus - difficult pelvic exam  Musculoskeletal: She exhibits no edema and no tenderness.  Lymphadenopathy:    She has no cervical adenopathy.  Neurological: She is alert. She has normal reflexes. No cranial nerve deficit. She exhibits normal muscle tone. Coordination normal.  Skin: Skin is warm and dry. No rash noted. No erythema. No pallor.  Psychiatric: She has a normal mood and affect.          Assessment & Plan:

## 2013-02-17 NOTE — Assessment & Plan Note (Signed)
a1c is 7.4- with peak sugar in evening - will go ahead and inc her pm metformin (and move it to dinner time) to 1000 mg Update if problems F/u in 3 mo after a1c Disc diet  Reminded her to make opthy appt

## 2013-02-17 NOTE — Assessment & Plan Note (Signed)
Disc goals for lipids and reasons to control them Rev labs with pt Rev low sat fat diet in detail  Needs to inc HDL-will start exercise regularly indoors when she cannot get out

## 2013-02-17 NOTE — Assessment & Plan Note (Signed)
Given IFOB- pt declines colonsc at this time

## 2013-02-17 NOTE — Assessment & Plan Note (Signed)
bp in fair control at this time  No changes needed  Disc lifstyle change with low sodium diet and exercise  Labs reviewed  

## 2013-02-17 NOTE — Assessment & Plan Note (Signed)
Discussed how this problem influences overall health and the risks it imposes  Reviewed plan for weight loss with lower calorie diet (via better food choices and also portion control or program like weight watchers) and exercise building up to or more than 30 minutes 5 days per week including some aerobic activity    

## 2013-02-17 NOTE — Assessment & Plan Note (Signed)
Reviewed health habits including diet and exercise and skin cancer prevention Also reviewed health mt list, fam hx and immunizations   Wellness labs rev Sent for last mammo from fall- could not find it in the chart Will check with ins about cov of zostavax

## 2013-02-22 ENCOUNTER — Encounter: Payer: Self-pay | Admitting: *Deleted

## 2013-03-01 ENCOUNTER — Encounter: Payer: Self-pay | Admitting: Family Medicine

## 2013-03-02 ENCOUNTER — Encounter: Payer: Self-pay | Admitting: Family Medicine

## 2013-03-10 ENCOUNTER — Other Ambulatory Visit: Payer: Self-pay | Admitting: Family Medicine

## 2013-03-12 ENCOUNTER — Other Ambulatory Visit (INDEPENDENT_AMBULATORY_CARE_PROVIDER_SITE_OTHER): Payer: BC Managed Care – PPO

## 2013-03-12 DIAGNOSIS — Z1211 Encounter for screening for malignant neoplasm of colon: Secondary | ICD-10-CM

## 2013-04-30 ENCOUNTER — Other Ambulatory Visit: Payer: Self-pay | Admitting: Family Medicine

## 2013-05-18 ENCOUNTER — Other Ambulatory Visit: Payer: Self-pay | Admitting: Family Medicine

## 2013-05-20 ENCOUNTER — Other Ambulatory Visit (INDEPENDENT_AMBULATORY_CARE_PROVIDER_SITE_OTHER): Payer: BC Managed Care – PPO

## 2013-05-20 DIAGNOSIS — E119 Type 2 diabetes mellitus without complications: Secondary | ICD-10-CM

## 2013-05-26 ENCOUNTER — Encounter: Payer: Self-pay | Admitting: Family Medicine

## 2013-05-26 ENCOUNTER — Ambulatory Visit (INDEPENDENT_AMBULATORY_CARE_PROVIDER_SITE_OTHER): Payer: BC Managed Care – PPO | Admitting: Family Medicine

## 2013-05-26 VITALS — BP 144/86 | HR 104 | Temp 98.6°F | Ht 65.0 in | Wt 192.5 lb

## 2013-05-26 DIAGNOSIS — J019 Acute sinusitis, unspecified: Secondary | ICD-10-CM | POA: Insufficient documentation

## 2013-05-26 DIAGNOSIS — E119 Type 2 diabetes mellitus without complications: Secondary | ICD-10-CM

## 2013-05-26 MED ORDER — METFORMIN HCL 1000 MG PO TABS: 1000.0000 mg | ORAL_TABLET | Freq: Two times a day (BID) | ORAL | Status: DC

## 2013-05-26 MED ORDER — AMOXICILLIN-POT CLAVULANATE 875-125 MG PO TABS: 1.0000 | ORAL_TABLET | Freq: Two times a day (BID) | ORAL | Status: DC

## 2013-05-26 NOTE — Assessment & Plan Note (Signed)
With hx of chronic sinusitis  Sinus pain/ cong for a week tx with augmentin Disc symptomatic care - see instructions on AVS  Update if not starting to improve in a week or if worsening   If wheezing worsens will update-has albuterol mdi

## 2013-05-26 NOTE — Progress Notes (Signed)
Subjective:    Patient ID: Katrina Smith, female    DOB: 01-27-1951, 62 y.o.   MRN: [id number]  HPI Here for f/u of DM  Has a cold - otherwise doing ok  Has had it since Friday  Coughing and congestion Now wheezing a bit -uses her pro air  No prod with cough  Blowing out green  Face is hurting - pressure No fever   Wt is stable with obese range bmi  Diabetes Home sugar results - pretty good overall  ams usually 90s-120s , and in the afternoon peaks afternoon- (never above 150s)-- checks it 1 hour after eating  No low sugars  DM diet - has really tried to watch what she eats  Exercise -walking 45 minutes per day after dinner -every day  Symptoms-- none at all  A1C last  Lab Results  Component Value Date   HGBA1C 7.2* 05/20/2013   This is down from 7.4 No problems with medications - last visit inc her pm metformin to 500 tid  Renal protection- on ARB Last eye exam - last year - due for one    Patient Active Problem List   Diagnosis Date Noted  . Colon cancer screening 02/17/2013  . Encounter for routine gynecological examination 02/17/2013  . Other screening mammogram 09/25/2012  . Routine general medical examination at a health care facility 09/01/2012  . Allergic rhinitis   . Toxemia in pregnancy   . Chronic sinusitis   . OBESITY 05/27/2008  . MOTION SICKNESS 07/08/2007  . Type II or unspecified type diabetes mellitus without mention of complication, not stated as uncontrolled 07/07/2007  . HYPERCHOLESTEROLEMIA, PURE 07/07/2007  . HYPERTENSION 07/07/2007  . ALLERGIC RHINITIS 07/07/2007  . ASTHMA 07/07/2007   Past Medical History  Diagnosis Date  . Allergic rhinitis   . Asthma     mild  . Diabetes mellitus type II   . HTN, white coat   . Toxemia in pregnancy     x 2  . Chronic sinusitis     had nasal surgery   Past Surgical History  Procedure Laterality Date  . Cesarean section      x2  . Cholecystectomy  10/2004    gallstones  . Nasal sinus surgery   02/2008   History  Substance Use Topics  . Smoking status: Never Smoker   . Smokeless tobacco: Not on file  . Alcohol Use: No   Family History  Problem Relation Age of Onset  . Hypertension Father   . Diabetes Mother   . Dementia Mother   . Heart disease Mother   . Rheum arthritis Brother   . Cancer Brother     kidney   Allergies  Allergen Reactions  . Other     Poppy seeds  . Xopenex (Levalbuterol Hydrochloride) Other (See Comments)    Throat closed up.   Current Outpatient Prescriptions on File Prior to Visit  Medication Sig Dispense Refill  . albuterol (PROAIR HFA) 108 (90 BASE) MCG/ACT inhaler Inhale 2 puffs into the lungs every 6 (six) hours as needed.        Marland Kitchen amLODipine-atorvastatin (CADUET) 5-10 MG per tablet TAKE 1 TABLET BY MOUTH DAILY.  30 tablet  2  . aspirin EC 81 MG tablet Take 81 mg by mouth daily.        . Calcium Carbonate-Vitamin D (CALTRATE 600+D) 600-400 MG-UNIT per chew tablet Chew 2 tablets by mouth daily.        . cetirizine (ZYRTEC) 10 MG  tablet Take 10 mg by mouth daily.       . fluticasone (FLONASE) 50 MCG/ACT nasal spray 2 sprays by Nasal route daily.        Marland Kitchen GLIPIZIDE XL 5 MG 24 hr tablet TAKE 1 TABLET (5 MG TOTAL) DAILY  90 tablet  0  . glucose blood (ACCU-CHEK COMPACT TEST DRUM) test strip Use as instructed  102 each  3  . glucose blood test strip 1 each by Other route as directed. Use as instructed       . ketoconazole (NIZORAL) 2 % cream Apply 1 application topically as needed.       Marland Kitchen losartan-hydrochlorothiazide (HYZAAR) 100-25 MG per tablet TAKE 1 TABLET DAILY  90 tablet  0  . metFORMIN (GLUCOPHAGE) 500 MG tablet Take 1 pill in am by mouth and 2 pills at dinner time  270 tablet  3  . montelukast (SINGULAIR) 10 MG tablet TAKE 1 TABLET (10 MG TOTAL) AT BEDTIME  90 tablet  1  . naphazoline-pheniramine (NAPHCON-A) 0.025-0.3 % ophthalmic solution Place 1 drop into both eyes 4 (four) times daily as needed.        . Omega-3 Fatty Acids (FISH OIL  CONCENTRATE PO) Take 1 capsule by mouth 2 (two) times daily.         No current facility-administered medications on file prior to visit.     Review of Systems Review of Systems  Constitutional: Negative for fever, appetite change, and unexpected weight change. ENT pos for nasal cong/ sinus pain and purulent nasal drainage   Eyes: Negative for pain and visual disturbance.  Respiratory: Negative for cough and shortness of breath.   Cardiovascular: Negative for cp or palpitations    Gastrointestinal: Negative for nausea, diarrhea and constipation.  Genitourinary: Negative for urgency and frequency.  Skin: Negative for pallor or rash   Neurological: Negative for weakness, light-headedness, numbness and headaches.  Hematological: Negative for adenopathy. Does not bruise/bleed easily.  Psychiatric/Behavioral: Negative for dysphoric mood. The patient is not nervous/anxious.         Objective:   Physical Exam  Constitutional: She appears well-developed and well-nourished. No distress.  obese and well appearing   HENT:  Head: Normocephalic and atraumatic.  Right Ear: External ear normal.  Left Ear: External ear normal.  Mouth/Throat: Oropharynx is clear and moist. No oropharyngeal exudate.  Nares are injected and congested  bilat maxillary sinus tenderness worse on L Very nasal voice  Throat-post nasal drip   Eyes: Conjunctivae and EOM are normal. Pupils are equal, round, and reactive to light. Right eye exhibits no discharge. Left eye exhibits no discharge. No scleral icterus.  Neck: Normal range of motion. Neck supple. No JVD present. Carotid bruit is not present. No thyromegaly present.  Cardiovascular: Normal rate and regular rhythm.   Pulmonary/Chest: Effort normal. No respiratory distress. She has wheezes. She has no rales. She exhibits no tenderness.  Harsh bs with wheeze on forced exp  No prolonged exp phase or crackles/rales  Abdominal: Soft. Bowel sounds are normal. She  exhibits no distension and no mass. There is no tenderness.  Musculoskeletal: She exhibits no edema and no tenderness.  Lymphadenopathy:    She has no cervical adenopathy.  Neurological: She is alert. She has normal reflexes. No cranial nerve deficit. Coordination normal.  Skin: Skin is warm and dry. No rash noted. No erythema. No pallor.  Psychiatric: She has a normal mood and affect.          Assessment &  Plan:

## 2013-05-26 NOTE — Assessment & Plan Note (Signed)
A1c is 7.2 ? When sugar is peaking Stop glipizide Inc metformin to 1000 bid and watch closely She will f/u with opthy F/u 3 mo with lab prior

## 2013-05-26 NOTE — Patient Instructions (Addendum)
Keep working on diet and exercise  Take the augmentin for sinus infection  Stop glipizide for now  Take metformin 1000 mg twice daily  Check sugars at some odd times  Follow up in 3 months with labs prior

## 2013-06-13 ENCOUNTER — Other Ambulatory Visit: Payer: Self-pay | Admitting: Family Medicine

## 2013-07-06 ENCOUNTER — Other Ambulatory Visit: Payer: Self-pay | Admitting: Family Medicine

## 2013-08-20 ENCOUNTER — Other Ambulatory Visit: Payer: BC Managed Care – PPO

## 2013-08-23 ENCOUNTER — Other Ambulatory Visit (INDEPENDENT_AMBULATORY_CARE_PROVIDER_SITE_OTHER): Payer: BC Managed Care – PPO

## 2013-08-23 DIAGNOSIS — E119 Type 2 diabetes mellitus without complications: Secondary | ICD-10-CM

## 2013-08-23 LAB — COMPREHENSIVE METABOLIC PANEL
AST: 18 U/L (ref 0–37)
Alkaline Phosphatase: 74 U/L (ref 39–117)
CO2: 31 mEq/L (ref 19–32)
Calcium: 9.2 mg/dL (ref 8.4–10.5)
Chloride: 98 mEq/L (ref 96–112)
Glucose, Bld: 124 mg/dL — ABNORMAL HIGH (ref 70–99)
Potassium: 4.3 mEq/L (ref 3.5–5.1)
Sodium: 135 mEq/L (ref 135–145)

## 2013-08-23 LAB — HEMOGLOBIN A1C: Hgb A1c MFr Bld: 7.5 % — ABNORMAL HIGH (ref 4.6–6.5)

## 2013-08-27 ENCOUNTER — Ambulatory Visit (INDEPENDENT_AMBULATORY_CARE_PROVIDER_SITE_OTHER): Payer: BC Managed Care – PPO | Admitting: Family Medicine

## 2013-08-27 ENCOUNTER — Encounter: Payer: Self-pay | Admitting: Family Medicine

## 2013-08-27 VITALS — BP 128/70 | HR 108 | Temp 98.6°F | Ht 65.0 in | Wt 175.2 lb

## 2013-08-27 DIAGNOSIS — Z23 Encounter for immunization: Secondary | ICD-10-CM

## 2013-08-27 DIAGNOSIS — E119 Type 2 diabetes mellitus without complications: Secondary | ICD-10-CM

## 2013-08-27 DIAGNOSIS — I10 Essential (primary) hypertension: Secondary | ICD-10-CM

## 2013-08-27 NOTE — Patient Instructions (Addendum)
Take your metformin religiously three times daily  Continue other medicines Keep walking / loosing weight and watching diet  Follow up in 3 months with labs prior for diabetes

## 2013-08-27 NOTE — Progress Notes (Signed)
Subjective:    Patient ID: Katrina Smith, female    DOB: December 30, 1950, 62 y.o.   MRN: [id number]  HPI Here for f/u of chronic medical conditions   Wt is down 17 lb with bmi of 29  Some stress- taking care of mother now with alzhiemers   Diabetes Home sugar results -not as good  DM diet - has cut back on sugar and eating less  Exercise - has been walking a lot more  Symptoms A1C last  Lab Results  Component Value Date   HGBA1C 7.5* 08/23/2013  this is up   No problems with medications -last visit inc metformin to 1000 bid -- cut it down to 500 am / 500 mid day and 500 at night , still taking glipizide  Has been missing doses of med due to stress Renal protection- on ARB Last eye exam   Will be moving to [facility] in the future   Flu shot today   bp is stable today  No cp or palpitations or headaches or edema  No side effects to medicines  BP Readings from Last 3 Encounters:  08/27/13 128/70  05/26/13 144/86  02/17/13 144/78       Patient Active Problem List   Diagnosis Date Noted  . Sinusitis, acute 05/26/2013  . Colon cancer screening 02/17/2013  . Encounter for routine gynecological examination 02/17/2013  . Other screening mammogram 09/25/2012  . Routine general medical examination at a health care facility 09/01/2012  . Allergic rhinitis   . Toxemia in pregnancy   . Chronic sinusitis   . OBESITY 05/27/2008  . MOTION SICKNESS 07/08/2007  . Type II or unspecified type diabetes mellitus without mention of complication, not stated as uncontrolled 07/07/2007  . HYPERCHOLESTEROLEMIA, PURE 07/07/2007  . HYPERTENSION 07/07/2007  . ALLERGIC RHINITIS 07/07/2007  . ASTHMA 07/07/2007   Past Medical History  Diagnosis Date  . Allergic rhinitis   . Asthma     mild  . Diabetes mellitus type II   . HTN, white coat   . Toxemia in pregnancy     x 2  . Chronic sinusitis     had nasal surgery   Past Surgical History  Procedure Laterality Date  . Cesarean section      x2   . Cholecystectomy  10/2004    gallstones  . Nasal sinus surgery  02/2008   History  Substance Use Topics  . Smoking status: Never Smoker   . Smokeless tobacco: Not on file  . Alcohol Use: No   Family History  Problem Relation Age of Onset  . Hypertension Father   . Diabetes Mother   . Dementia Mother   . Heart disease Mother   . Rheum arthritis Brother   . Cancer Brother     kidney   Allergies  Allergen Reactions  . Other     Poppy seeds  . Xopenex [Levalbuterol] Other (See Comments)    Throat closed up.   Current Outpatient Prescriptions on File Prior to Visit  Medication Sig Dispense Refill  . albuterol (PROAIR HFA) 108 (90 BASE) MCG/ACT inhaler Inhale 2 puffs into the lungs every 6 (six) hours as needed.        Marland Kitchen amLODipine-atorvastatin (CADUET) 5-10 MG per tablet TAKE 1 TABLET BY MOUTH DAILY.  30 tablet  5  . amoxicillin-clavulanate (AUGMENTIN) 875-125 MG per tablet Take 1 tablet by mouth 2 (two) times daily.  14 tablet  0  . aspirin EC 81 MG tablet Take 81 mg  by mouth daily.        . beclomethasone (QVAR) 40 MCG/ACT inhaler Inhale 2 puffs into the lungs as needed.      . Calcium Carbonate-Vitamin D (CALTRATE 600+D) 600-400 MG-UNIT per chew tablet Chew 2 tablets by mouth daily.        . cetirizine (ZYRTEC) 10 MG tablet Take 10 mg by mouth daily.       . fluticasone (FLONASE) 50 MCG/ACT nasal spray 2 sprays by Nasal route daily.        Marland Kitchen GLIPIZIDE XL 5 MG 24 hr tablet TAKE 1 TABLET DAILY  90 tablet  0  . glucose blood (ACCU-CHEK COMPACT TEST DRUM) test strip Use as instructed  102 each  3  . glucose blood test strip 1 each by Other route as directed. Use as instructed       . ketoconazole (NIZORAL) 2 % cream Apply 1 application topically as needed.       Marland Kitchen losartan-hydrochlorothiazide (HYZAAR) 100-25 MG per tablet TAKE 1 TABLET DAILY  90 tablet  0  . metFORMIN (GLUCOPHAGE) 1000 MG tablet Take 1 tablet (1,000 mg total) by mouth 2 (two) times daily with a meal.  180 tablet   3  . montelukast (SINGULAIR) 10 MG tablet TAKE 1 TABLET (10 MG TOTAL) AT BEDTIME  90 tablet  1  . naphazoline-pheniramine (NAPHCON-A) 0.025-0.3 % ophthalmic solution Place 1 drop into both eyes 4 (four) times daily as needed.        . Omega-3 Fatty Acids (FISH OIL CONCENTRATE PO) Take 1 capsule by mouth 2 (two) times daily.         No current facility-administered medications on file prior to visit.    Review of Systems Review of Systems  Constitutional: Negative for fever, appetite change, fatigue and unexpected weight change.  Eyes: Negative for pain and visual disturbance.  Respiratory: Negative for cough and shortness of breath.   Cardiovascular: Negative for cp or palpitations    Gastrointestinal: Negative for nausea, diarrhea and constipation.  Genitourinary: Negative for urgency and frequency.  Skin: Negative for pallor or rash   Neurological: Negative for weakness, light-headedness, numbness and headaches.  Hematological: Negative for adenopathy. Does not bruise/bleed easily.  Psychiatric/Behavioral: Negative for dysphoric mood. The patient is not nervous/anxious.  pos for stressors/ caring for her mother        Objective:   Physical Exam  Constitutional: She appears well-developed and well-nourished. No distress.  obese and well appearing   HENT:  Head: Normocephalic and atraumatic.  Mouth/Throat: Oropharynx is clear and moist.  Eyes: Conjunctivae and EOM are normal. Pupils are equal, round, and reactive to light. Right eye exhibits no discharge. Left eye exhibits no discharge. No scleral icterus.  Neck: Normal range of motion. Neck supple. No JVD present. Carotid bruit is not present. No thyromegaly present.  Cardiovascular: Normal rate, regular rhythm and intact distal pulses.  Exam reveals no gallop.   Pulmonary/Chest: Effort normal and breath sounds normal. No respiratory distress. She has no wheezes.  Abdominal: She exhibits no abdominal bruit.  Musculoskeletal: She  exhibits no edema.  Lymphadenopathy:    She has no cervical adenopathy.  Neurological: She is alert. She has normal reflexes. No cranial nerve deficit. She exhibits normal muscle tone. Coordination normal.  Skin: Skin is warm and dry. No rash noted. No erythema. No pallor.  Psychiatric: She has a normal mood and affect.          Assessment & Plan:

## 2013-08-29 NOTE — Assessment & Plan Note (Signed)
A1C is up and pt has been noncompliant with her DM meds  Can tolerate metformin tid and she seems to have better sugars doing this  Will work harder on compliance despite stress  Rev diet / exercise

## 2013-08-29 NOTE — Assessment & Plan Note (Signed)
bp in fair control at this time  No changes needed  Disc lifstyle change with low sodium diet and exercise   

## 2013-09-21 ENCOUNTER — Encounter: Payer: Self-pay | Admitting: Family Medicine

## 2013-10-22 ENCOUNTER — Other Ambulatory Visit: Payer: Self-pay | Admitting: Family Medicine

## 2013-11-07 ENCOUNTER — Other Ambulatory Visit: Payer: Self-pay | Admitting: Family Medicine

## 2013-11-15 ENCOUNTER — Encounter: Payer: Self-pay | Admitting: Family Medicine

## 2013-11-16 ENCOUNTER — Other Ambulatory Visit: Payer: Self-pay | Admitting: *Deleted

## 2013-11-16 ENCOUNTER — Telehealth: Payer: Self-pay | Admitting: Family Medicine

## 2013-11-16 MED ORDER — LOSARTAN POTASSIUM-HCTZ 100-25 MG PO TABS: ORAL_TABLET | ORAL | Status: DC

## 2013-11-16 NOTE — Telephone Encounter (Signed)
Pt needs 10 pills to get by hyzaar until mail comes I sent electronically

## 2013-11-23 ENCOUNTER — Other Ambulatory Visit: Payer: BC Managed Care – PPO

## 2013-11-30 ENCOUNTER — Ambulatory Visit: Payer: BC Managed Care – PPO | Admitting: Family Medicine

## 2013-12-11 ENCOUNTER — Other Ambulatory Visit: Payer: Self-pay | Admitting: Family Medicine

## 2013-12-31 ENCOUNTER — Other Ambulatory Visit (INDEPENDENT_AMBULATORY_CARE_PROVIDER_SITE_OTHER): Payer: BC Managed Care – PPO

## 2013-12-31 DIAGNOSIS — E119 Type 2 diabetes mellitus without complications: Secondary | ICD-10-CM

## 2013-12-31 LAB — HEMOGLOBIN A1C: Hgb A1c MFr Bld: 7.9 % — ABNORMAL HIGH (ref 4.6–6.5)

## 2014-01-05 ENCOUNTER — Ambulatory Visit: Payer: BC Managed Care – PPO | Admitting: Family Medicine

## 2014-01-07 ENCOUNTER — Ambulatory Visit (INDEPENDENT_AMBULATORY_CARE_PROVIDER_SITE_OTHER): Payer: BC Managed Care – PPO | Admitting: Family Medicine

## 2014-01-07 ENCOUNTER — Encounter: Payer: Self-pay | Admitting: Family Medicine

## 2014-01-07 VITALS — BP 136/88 | HR 96 | Temp 97.4°F | Ht 65.0 in | Wt 186.5 lb

## 2014-01-07 DIAGNOSIS — I10 Essential (primary) hypertension: Secondary | ICD-10-CM

## 2014-01-07 DIAGNOSIS — E669 Obesity, unspecified: Secondary | ICD-10-CM

## 2014-01-07 DIAGNOSIS — E119 Type 2 diabetes mellitus without complications: Secondary | ICD-10-CM

## 2014-01-07 MED ORDER — GLIPIZIDE ER 5 MG PO TB24: ORAL_TABLET | ORAL | Status: DC

## 2014-01-07 MED ORDER — LOSARTAN POTASSIUM-HCTZ 100-25 MG PO TABS: ORAL_TABLET | ORAL | Status: DC

## 2014-01-07 NOTE — Progress Notes (Signed)
Pre-visit discussion using our clinic review tool. No additional management support is needed unless otherwise documented below in the visit note.  

## 2014-01-07 NOTE — Progress Notes (Signed)
Subjective:    Patient ID: Katrina Smith, female    DOB: [birth year], 63 y.o.   MRN: [id number]  HPI Here for f/u of chronic health problems   Taking care of mother with alz dz - and very difficult - waiting to get her into a facility- waiting list  Very stressful - bp one night 188/100 - when she was dealing with her agitation  Husband works in the [employer] and comes home on the weekends    Wt is up 11 lb with bmi of 31   ( thinks last wt was not accurate) -- down to 182 on her scales  No time to take care of herself at all   bp is stable today  No cp or palpitations or headaches or edema  No side effects to medicines  BP Readings from Last 3 Encounters:  01/07/14 136/88  08/27/13 128/70  05/26/13 144/86      Diabetes Home sugar results - her sugars are higher in the ams - and not taking her glipizide  DM diet - not optimal/ but not too bad  Exercise  -walks the dog when good outside  Symptoms -none  A1C last  Lab Results  Component Value Date   HGBA1C 7.9* 12/31/2013  ? Stress related   No problems with medications  Renal protection on arb Last eye exam 10/13  Patient Active Problem List   Diagnosis Date Noted  . Colon cancer screening 02/17/2013  . Encounter for routine gynecological examination 02/17/2013  . Other screening mammogram 09/25/2012  . Routine general medical examination at a health care facility 09/01/2012  . Allergic rhinitis   . Toxemia in pregnancy   . Chronic sinusitis   . OBESITY 05/27/2008  . MOTION SICKNESS 07/08/2007  . Type II or unspecified type diabetes mellitus without mention of complication, not stated as uncontrolled 07/07/2007  . HYPERCHOLESTEROLEMIA, PURE 07/07/2007  . HYPERTENSION 07/07/2007  . ALLERGIC RHINITIS 07/07/2007  . ASTHMA 07/07/2007   Past Medical History  Diagnosis Date  . Allergic rhinitis   . Asthma     mild  . Diabetes mellitus type II   . HTN, white coat   . Toxemia in pregnancy     x 2  . Chronic  sinusitis     had nasal surgery   Past Surgical History  Procedure Laterality Date  . Cesarean section      x2  . Cholecystectomy  10/2004    gallstones  . Nasal sinus surgery  02/2008   History  Substance Use Topics  . Smoking status: Never Smoker   . Smokeless tobacco: Not on file  . Alcohol Use: No   Family History  Problem Relation Age of Onset  . Hypertension Father   . Diabetes Mother   . Dementia Mother   . Heart disease Mother   . Rheum arthritis Brother   . Cancer Brother     kidney   Allergies  Allergen Reactions  . Other     Poppy seeds  . Xopenex [Levalbuterol] Other (See Comments)    Throat closed up.   Current Outpatient Prescriptions on File Prior to Visit  Medication Sig Dispense Refill  . albuterol (PROAIR HFA) 108 (90 BASE) MCG/ACT inhaler Inhale 2 puffs into the lungs every 6 (six) hours as needed.        Marland Kitchen amLODipine-atorvastatin (CADUET) 5-10 MG per tablet TAKE 1 TABLET BY MOUTH EVERY DAY  30 tablet  1  . aspirin EC 81 MG  tablet Take 81 mg by mouth daily.        . beclomethasone (QVAR) 40 MCG/ACT inhaler Inhale 2 puffs into the lungs as needed.      . cetirizine (ZYRTEC) 10 MG tablet Take 10 mg by mouth daily.       . fluticasone (FLONASE) 50 MCG/ACT nasal spray 2 sprays by Nasal route daily.        Marland Kitchen glucose blood test strip 1 each by Other route as directed. Use as instructed       . ketoconazole (NIZORAL) 2 % cream Apply 1 application topically as needed.       Marland Kitchen losartan-hydrochlorothiazide (HYZAAR) 100-25 MG per tablet TAKE 1 TABLET DAILY  10 tablet  0  . metFORMIN (GLUCOPHAGE) 1000 MG tablet Take 1 tablet (1,000 mg total) by mouth 2 (two) times daily with a meal.  180 tablet  3  . montelukast (SINGULAIR) 10 MG tablet TAKE 1 TABLET AT BEDTIME  90 tablet  1  . naphazoline-pheniramine (NAPHCON-A) 0.025-0.3 % ophthalmic solution Place 1 drop into both eyes 4 (four) times daily as needed.        . Omega-3 Fatty Acids (FISH OIL CONCENTRATE PO) Take  1 capsule by mouth 2 (two) times daily.        Marland Kitchen GLIPIZIDE XL 5 MG 24 hr tablet TAKE 1 TABLET DAILY  90 tablet  0   No current facility-administered medications on file prior to visit.       Review of Systems    Review of Systems  Constitutional: Negative for fever, appetite change, fatigue and unexpected weight change.  Eyes: Negative for pain and visual disturbance.  Respiratory: Negative for cough and shortness of breath.   Cardiovascular: Negative for cp or palpitations    Gastrointestinal: Negative for nausea, diarrhea and constipation.  Genitourinary: Negative for urgency and frequency.  Skin: Negative for pallor or rash   Neurological: Negative for weakness, light-headedness, numbness and headaches.  Hematological: Negative for adenopathy. Does not bruise/bleed easily.  Psychiatric/Behavioral: Negative for dysphoric mood. The patient is not nervous/anxious.  pos for stressors     Objective:   Physical Exam  Constitutional: She appears well-developed and well-nourished. No distress.  obese and well appearing   HENT:  Head: Normocephalic and atraumatic.  Mouth/Throat: Oropharynx is clear and moist.  Eyes: Conjunctivae and EOM are normal. Pupils are equal, round, and reactive to light. No scleral icterus.  Neck: Normal range of motion. Neck supple. Carotid bruit is not present. No thyromegaly present.  Cardiovascular: Regular rhythm and normal heart sounds.   Pulmonary/Chest: Effort normal and breath sounds normal. No respiratory distress. She has no wheezes. She has no rales.  Musculoskeletal: She exhibits no edema.  Lymphadenopathy:    She has no cervical adenopathy.  Neurological: She is alert. She has normal reflexes.  Skin: Skin is warm. No rash noted. No erythema. No pallor.  Psychiatric: She has a normal mood and affect.          Assessment & Plan:

## 2014-01-07 NOTE — Patient Instructions (Signed)
Start back on glipizide 5 mg - take it at dinner time  Continue the metformin  Get your eye doctor appt when you move Follow up in about 3 months    (if you can do labs prior- do it , if you live out of town , I understand)  Keep working on diet and exercise  If you are interested in a shingles/zoster vaccine - call your insurance to check on coverage,( you should not get it within 1 month of other vaccines) , then call us for a prescription  for it to take to a pharmacy that gives the shot , or make a nurse visit to get it here depending on your coverage

## 2014-01-09 NOTE — Assessment & Plan Note (Signed)
Discussed how this problem influences overall health and the risks it imposes  Reviewed plan for weight loss with lower calorie diet (via better food choices and also portion control or program like weight watchers) and exercise building up to or more than 30 minutes 5 days per week including some aerobic activity   Pt will have more time for self care when she can place her mother

## 2014-01-09 NOTE — Assessment & Plan Note (Signed)
Not taking care of herself due to caregiver stress  Rev low glycemic diet  Will start back on glipizide - in the pm due to high am sugars (stress rel cortisol peaks at night) Will monitor sugar  F/u planned

## 2014-01-09 NOTE — Assessment & Plan Note (Signed)
BP: 136/88 mmHg  bp in fair control at this time  No changes needed Disc lifstyle change with low sodium diet and exercise   This has gone up with stress - temporarily- will watch

## 2014-01-11 ENCOUNTER — Telehealth: Payer: Self-pay

## 2014-01-11 NOTE — Telephone Encounter (Signed)
Relevant patient education assigned to patient using Emmi. ° °

## 2014-01-19 ENCOUNTER — Telehealth: Payer: Self-pay | Admitting: Family Medicine

## 2014-01-19 NOTE — Telephone Encounter (Signed)
Relevant patient education mailed to patient.  

## 2014-02-11 ENCOUNTER — Other Ambulatory Visit: Payer: Self-pay | Admitting: Family Medicine

## 2014-02-11 MED ORDER — AMLODIPINE-ATORVASTATIN 5-10 MG PO TABS: ORAL_TABLET | ORAL | Status: DC

## 2014-03-12 ENCOUNTER — Other Ambulatory Visit: Payer: Self-pay | Admitting: Family Medicine

## 2014-03-14 NOTE — Telephone Encounter (Signed)
That is ok-please refill times 3

## 2014-03-14 NOTE — Telephone Encounter (Signed)
Accu-Chek test strips have not been filled since 06/2012. Is this ok to feel?

## 2014-03-28 ENCOUNTER — Other Ambulatory Visit: Payer: Self-pay | Admitting: Family Medicine

## 2014-03-30 ENCOUNTER — Telehealth: Payer: Self-pay | Admitting: Family Medicine

## 2014-03-30 DIAGNOSIS — E119 Type 2 diabetes mellitus without complications: Secondary | ICD-10-CM

## 2014-03-30 DIAGNOSIS — I10 Essential (primary) hypertension: Secondary | ICD-10-CM

## 2014-03-30 NOTE — Telephone Encounter (Signed)
Message copied by Abner Greenspan on Wed Mar 30, 2014  7:30 PM ------      Message from: Ellamae Sia      Created: Tue Mar 29, 2014  4:01 PM      Regarding: Lab orders for Thursday, 4.16.15       Lab orders for f/u ------

## 2014-04-01 ENCOUNTER — Other Ambulatory Visit (INDEPENDENT_AMBULATORY_CARE_PROVIDER_SITE_OTHER): Payer: BC Managed Care – PPO

## 2014-04-01 DIAGNOSIS — I10 Essential (primary) hypertension: Secondary | ICD-10-CM

## 2014-04-01 DIAGNOSIS — E119 Type 2 diabetes mellitus without complications: Secondary | ICD-10-CM

## 2014-04-01 LAB — COMPREHENSIVE METABOLIC PANEL
ALK PHOS: 78 U/L (ref 39–117)
ALT: 31 U/L (ref 0–35)
AST: 20 U/L (ref 0–37)
Albumin: 3.9 g/dL (ref 3.5–5.2)
BILIRUBIN TOTAL: 0.4 mg/dL (ref 0.3–1.2)
BUN: 7 mg/dL (ref 6–23)
CO2: 29 mEq/L (ref 19–32)
CREATININE: 0.5 mg/dL (ref 0.4–1.2)
Calcium: 9.2 mg/dL (ref 8.4–10.5)
Chloride: 96 mEq/L (ref 96–112)
GFR: 146.35 mL/min (ref >60.00)
Glucose, Bld: 105 mg/dL — ABNORMAL HIGH (ref 70–99)
Potassium: 4.2 mEq/L (ref 3.5–5.1)
Sodium: 134 mEq/L — ABNORMAL LOW (ref 135–145)
Total Protein: 7.1 g/dL (ref 6.0–8.3)

## 2014-04-01 LAB — HEMOGLOBIN A1C: HEMOGLOBIN A1C: 7.1 % — AB (ref 4.6–6.5)

## 2014-04-08 ENCOUNTER — Ambulatory Visit (INDEPENDENT_AMBULATORY_CARE_PROVIDER_SITE_OTHER): Payer: BC Managed Care – PPO | Admitting: Family Medicine

## 2014-04-08 ENCOUNTER — Encounter: Payer: Self-pay | Admitting: Family Medicine

## 2014-04-08 VITALS — BP 132/70 | HR 96 | Temp 98.4°F | Ht 65.0 in | Wt 184.5 lb

## 2014-04-08 DIAGNOSIS — E119 Type 2 diabetes mellitus without complications: Secondary | ICD-10-CM

## 2014-04-08 DIAGNOSIS — I10 Essential (primary) hypertension: Secondary | ICD-10-CM

## 2014-04-08 DIAGNOSIS — E669 Obesity, unspecified: Secondary | ICD-10-CM

## 2014-04-08 NOTE — Patient Instructions (Signed)
Keep up the good effort with diet and exercise and weight loss  A1C is improved  If you have further low blood sugars  Follow up for an annual exam in 3-6 months with labs prior   If you are interested in a shingles/zoster vaccine - call your insurance to check on coverage,( you should not get it within 1 month of other vaccines) , then call us for a prescription  for it to take to a pharmacy that gives the shot , or make a nurse visit to get it here depending on your coverage

## 2014-04-08 NOTE — Progress Notes (Signed)
Pre visit review using our clinic review tool, if applicable. No additional management support is needed unless otherwise documented below in the visit note. 

## 2014-04-08 NOTE — Progress Notes (Signed)
Subjective:    Patient ID: Katrina Smith, female    DOB: [birth year], 63 y.o.   MRN: [id number]  HPI Here for f/u of chronic health problems  Wt is down 2 lb with bmi of 30 Wt Readings from Last 3 Encounters:  04/08/14 184 lb 8 oz (83.689 kg)  01/07/14 186 lb 8 oz (84.596 kg)  08/27/13 175 lb 4 oz (79.493 kg)    bp is stable today  No cp or palpitations or headaches or edema  No side effects to medicines  BP Readings from Last 3 Encounters:  04/08/14 132/70  01/07/14 136/88  08/27/13 128/70     DM Lab Results  Component Value Date   HGBA1C 7.1* 04/01/2014   This is down from 7.9 Added glipizide last time   avg glucose 80s-120 in am  ( once was in the 50s) That correlated with what she eats the night before  She will eat a protein snack before bed if she eats very light  In the afternoons - has not been over 135   She is going the right direction Lots of exercise and moving and packing- walks the dog  She is eating a lot smarter   Patient Active Problem List   Diagnosis Date Noted  . Colon cancer screening 02/17/2013  . Encounter for routine gynecological examination 02/17/2013  . Other screening mammogram 09/25/2012  . Routine general medical examination at a health care facility 09/01/2012  . Allergic rhinitis   . Toxemia in pregnancy   . Chronic sinusitis   . OBESITY 05/27/2008  . MOTION SICKNESS 07/08/2007  . Type II or unspecified type diabetes mellitus without mention of complication, not stated as uncontrolled 07/07/2007  . HYPERCHOLESTEROLEMIA, PURE 07/07/2007  . HYPERTENSION 07/07/2007  . ALLERGIC RHINITIS 07/07/2007  . ASTHMA 07/07/2007   Past Medical History  Diagnosis Date  . Allergic rhinitis   . Asthma     mild  . Diabetes mellitus type II   . HTN, white coat   . Toxemia in pregnancy     x 2  . Chronic sinusitis     had nasal surgery   Past Surgical History  Procedure Laterality Date  . Cesarean section      x2  . Cholecystectomy   10/2004    gallstones  . Nasal sinus surgery  02/2008   History  Substance Use Topics  . Smoking status: Never Smoker   . Smokeless tobacco: Not on file  . Alcohol Use: No   Family History  Problem Relation Age of Onset  . Hypertension Father   . Diabetes Mother   . Dementia Mother   . Heart disease Mother   . Rheum arthritis Brother   . Cancer Brother     kidney   Allergies  Allergen Reactions  . Other     Poppy seeds  . Xopenex [Levalbuterol] Other (See Comments)    Throat closed up.   Current Outpatient Prescriptions on File Prior to Visit  Medication Sig Dispense Refill  . ACCU-CHEK COMPACT PLUS test strip USE AS INSTRUCTED  102 each  3  . albuterol (PROAIR HFA) 108 (90 BASE) MCG/ACT inhaler Inhale 2 puffs into the lungs every 6 (six) hours as needed.        Marland Kitchen amLODipine-atorvastatin (CADUET) 5-10 MG per tablet TAKE 1 TABLET BY MOUTH EVERY DAY  30 tablet  5  . aspirin EC 81 MG tablet Take 81 mg by mouth daily.        Marland Kitchen  beclomethasone (QVAR) 40 MCG/ACT inhaler Inhale 2 puffs into the lungs as needed.      . cetirizine (ZYRTEC) 10 MG tablet Take 10 mg by mouth daily.       . fluticasone (FLONASE) 50 MCG/ACT nasal spray 2 sprays by Nasal route daily.        Marland Kitchen glipiZIDE (GLIPIZIDE XL) 5 MG 24 hr tablet TAKE 1 TABLET DAILY  90 tablet  3  . glucose blood test strip 1 each by Other route as directed. Use as instructed       . ketoconazole (NIZORAL) 2 % cream Apply 1 application topically as needed.       Marland Kitchen losartan-hydrochlorothiazide (HYZAAR) 100-25 MG per tablet TAKE 1 TABLET DAILY  90 tablet  3  . metFORMIN (GLUCOPHAGE) 1000 MG tablet Take 1 tablet (1,000 mg total) by mouth 2 (two) times daily with a meal.  180 tablet  3  . montelukast (SINGULAIR) 10 MG tablet TAKE 1 TABLET AT BEDTIME  90 tablet  1  . naphazoline-pheniramine (NAPHCON-A) 0.025-0.3 % ophthalmic solution Place 1 drop into both eyes 4 (four) times daily as needed.        . Omega-3 Fatty Acids (FISH OIL  CONCENTRATE PO) Take 1 capsule by mouth 2 (two) times daily.         No current facility-administered medications on file prior to visit.    Review of Systems Review of Systems  Constitutional: Negative for fever, appetite change, fatigue and unexpected weight change.  Eyes: Negative for pain and visual disturbance.  Respiratory: Negative for cough and shortness of breath.   Cardiovascular: Negative for cp or palpitations    Gastrointestinal: Negative for nausea, diarrhea and constipation.  Genitourinary: Negative for urgency and frequency.  Skin: Negative for pallor or rash   Neurological: Negative for weakness, light-headedness, numbness and headaches.  Hematological: Negative for adenopathy. Does not bruise/bleed easily.  Psychiatric/Behavioral: Negative for dysphoric mood. The patient is not nervous/anxious.         Objective:   Physical Exam  Constitutional: She appears well-developed and well-nourished. No distress.  obese and well appearing   HENT:  Head: Normocephalic and atraumatic.  Mouth/Throat: Oropharynx is clear and moist.  Eyes: Conjunctivae and EOM are normal. Pupils are equal, round, and reactive to light. No scleral icterus.  Neck: Normal range of motion. Neck supple. No JVD present. Carotid bruit is not present. No thyromegaly present.  Cardiovascular: Normal rate and regular rhythm.  Exam reveals no gallop.   Pulmonary/Chest: Breath sounds normal. No respiratory distress. She has no wheezes. She has no rales.  Abdominal: Soft. Bowel sounds are normal. She exhibits no distension, no abdominal bruit and no mass. There is no tenderness.  Musculoskeletal: She exhibits no edema.  Lymphadenopathy:    She has no cervical adenopathy.  Neurological: She is alert. She has normal reflexes.  Skin: Skin is warm and dry. No rash noted. No erythema. No pallor.  Psychiatric: She has a normal mood and affect.          Assessment & Plan:

## 2014-04-10 NOTE — Assessment & Plan Note (Signed)
Lab Results  Component Value Date   HGBA1C 7.1* 04/01/2014   Rev diet/exercise and need for wt loss This is improved No change in med F/u planned

## 2014-04-10 NOTE — Assessment & Plan Note (Signed)
Discussed how this problem influences overall health and the risks it imposes  Reviewed plan for weight loss with lower calorie diet (via better food choices and also portion control or program like weight watchers) and exercise building up to or more than 30 minutes 5 days per week including some aerobic activity    

## 2014-04-10 NOTE — Assessment & Plan Note (Signed)
bp in fair control at this time  BP Readings from Last 1 Encounters:  04/08/14 132/70   No changes needed Disc lifstyle change with low sodium diet and exercise   Labs rev

## 2014-04-11 ENCOUNTER — Telehealth: Payer: Self-pay | Admitting: Family Medicine

## 2014-04-11 NOTE — Telephone Encounter (Signed)
Relevant patient education assigned to patient using Emmi. ° °

## 2014-04-26 ENCOUNTER — Encounter: Payer: Self-pay | Admitting: Family Medicine

## 2014-04-27 ENCOUNTER — Encounter: Payer: Self-pay | Admitting: Family Medicine

## 2014-04-27 ENCOUNTER — Ambulatory Visit (INDEPENDENT_AMBULATORY_CARE_PROVIDER_SITE_OTHER): Payer: BC Managed Care – PPO | Admitting: Family Medicine

## 2014-04-27 VITALS — BP 134/78 | HR 104 | Temp 97.9°F | Ht 65.0 in | Wt 184.5 lb

## 2014-04-27 DIAGNOSIS — N904 Leukoplakia of vulva: Secondary | ICD-10-CM | POA: Insufficient documentation

## 2014-04-27 DIAGNOSIS — L940 Localized scleroderma [morphea]: Secondary | ICD-10-CM

## 2014-04-27 MED ORDER — CLOBETASOL PROPIONATE 0.05 % EX CREA: 1.0000 "application " | TOPICAL_CREAM | Freq: Two times a day (BID) | CUTANEOUS | Status: DC

## 2014-04-27 NOTE — Patient Instructions (Signed)
Use the clobetasol cream twice daily to affected area (both sides)  Avoid scents and colors in products  Follow up with me for a re check in 2-3 weeks  If worse however- call and let me know

## 2014-04-27 NOTE — Progress Notes (Signed)
Pre visit review using our clinic review tool, if applicable. No additional management support is needed unless otherwise documented below in the visit note. 

## 2014-04-27 NOTE — Progress Notes (Signed)
Subjective:    Patient ID: Katrina Smith, female    DOB: 03-27-1951, 63 y.o.   MRN: [id number]  HPI Has some irritation in vulvar area - external  No itching  Some burning  No discharge   Noted the external vulva has some pale to white patches on it  Looked up her symptoms on line and she is concerned about lichen sclerosis Is not sexually active currently and denies poss of STD  She changed brands of toilet paper and no help  Used summer's eve wipes  Uses soap - on area - ivory   No urinary symptoms   Patient Active Problem List   Diagnosis Date Noted  . Lichen sclerosus of female genitalia 04/27/2014  . Colon cancer screening 02/17/2013  . Encounter for routine gynecological examination 02/17/2013  . Other screening mammogram 09/25/2012  . Routine general medical examination at a health care facility 09/01/2012  . Allergic rhinitis   . Toxemia in pregnancy   . Chronic sinusitis   . OBESITY 05/27/2008  . MOTION SICKNESS 07/08/2007  . Type II or unspecified type diabetes mellitus without mention of complication, not stated as uncontrolled 07/07/2007  . HYPERCHOLESTEROLEMIA, PURE 07/07/2007  . HYPERTENSION 07/07/2007  . ALLERGIC RHINITIS 07/07/2007  . ASTHMA 07/07/2007   Past Medical History  Diagnosis Date  . Allergic rhinitis   . Asthma     mild  . Diabetes mellitus type II   . HTN, white coat   . Toxemia in pregnancy     x 2  . Chronic sinusitis     had nasal surgery   Past Surgical History  Procedure Laterality Date  . Cesarean section      x2  . Cholecystectomy  10/2004    gallstones  . Nasal sinus surgery  02/2008   History  Substance Use Topics  . Smoking status: Never Smoker   . Smokeless tobacco: Not on file  . Alcohol Use: No   Family History  Problem Relation Age of Onset  . Hypertension Father   . Diabetes Mother   . Dementia Mother   . Heart disease Mother   . Rheum arthritis Brother   . Cancer Brother     kidney   Allergies    Allergen Reactions  . Other     Poppy seeds  . Xopenex [Levalbuterol] Other (See Comments)    Throat closed up.   Current Outpatient Prescriptions on File Prior to Visit  Medication Sig Dispense Refill  . ACCU-CHEK COMPACT PLUS test strip USE AS INSTRUCTED  102 each  3  . albuterol (PROAIR HFA) 108 (90 BASE) MCG/ACT inhaler Inhale 2 puffs into the lungs every 6 (six) hours as needed.        Marland Kitchen amLODipine-atorvastatin (CADUET) 5-10 MG per tablet TAKE 1 TABLET BY MOUTH EVERY DAY  30 tablet  5  . aspirin EC 81 MG tablet Take 81 mg by mouth daily.        . beclomethasone (QVAR) 40 MCG/ACT inhaler Inhale 2 puffs into the lungs as needed.      . cetirizine (ZYRTEC) 10 MG tablet Take 10 mg by mouth daily.       . fluticasone (FLONASE) 50 MCG/ACT nasal spray 2 sprays by Nasal route daily.        Marland Kitchen glipiZIDE (GLIPIZIDE XL) 5 MG 24 hr tablet TAKE 1 TABLET DAILY  90 tablet  3  . glucose blood test strip 1 each by Other route as directed. Use as instructed       .  ketoconazole (NIZORAL) 2 % cream Apply 1 application topically as needed.       Marland Kitchen losartan-hydrochlorothiazide (HYZAAR) 100-25 MG per tablet TAKE 1 TABLET DAILY  90 tablet  3  . metFORMIN (GLUCOPHAGE) 1000 MG tablet Take 1 tablet (1,000 mg total) by mouth 2 (two) times daily with a meal.  180 tablet  3  . montelukast (SINGULAIR) 10 MG tablet TAKE 1 TABLET AT BEDTIME  90 tablet  1  . naphazoline-pheniramine (NAPHCON-A) 0.025-0.3 % ophthalmic solution Place 1 drop into both eyes 4 (four) times daily as needed.        . Omega-3 Fatty Acids (FISH OIL CONCENTRATE PO) Take 1 capsule by mouth 2 (two) times daily.         No current facility-administered medications on file prior to visit.    Review of Systems Review of Systems  Constitutional: Negative for fever, appetite change, fatigue and unexpected weight change.  Eyes: Negative for pain and visual disturbance.  Respiratory: Negative for cough and shortness of breath.   Cardiovascular:  Negative for cp or palpitations    Gastrointestinal: Negative for nausea, diarrhea and constipation.  Genitourinary: Negative for urgency and frequency. pos for baseline vaginal dryness / pos for vulvar irritation Skin: Negative for pallor or rash   Neurological: Negative for weakness, light-headedness, numbness and headaches.  Hematological: Negative for adenopathy. Does not bruise/bleed easily.  Psychiatric/Behavioral: Negative for dysphoric mood. The patient is not nervous/anxious.         Objective:   Physical Exam  Constitutional: She appears well-developed and well-nourished. No distress.  obese and well appearing   HENT:  Head: Normocephalic and atraumatic.  Eyes: Conjunctivae and EOM are normal. Pupils are equal, round, and reactive to light. No scleral icterus.  Neck: Normal range of motion. Neck supple.  Cardiovascular: Normal rate and regular rhythm.   Pulmonary/Chest: Effort normal and breath sounds normal.  Abdominal: Soft. Bowel sounds are normal. She exhibits no distension and no mass. There is no tenderness.  No suprapubic tenderness or fullness    Genitourinary:  Labia majora and minora are pale with white patches and mildly tender No vag d/c Small shallow ulcer on R   No swelling   Lymphadenopathy:    She has no cervical adenopathy.  Neurological: She is alert.  Skin: Skin is warm and dry. No rash noted.  Psychiatric: She has a normal mood and affect.          Assessment & Plan:

## 2014-04-28 LAB — POCT WET PREP (WET MOUNT): KOH Wet Prep POC: NEGATIVE

## 2014-04-28 NOTE — Assessment & Plan Note (Signed)
Will tx with clobetasol cream bid for 2 weeks and f/u  She will avoid hot water/ or detergents/ fragrances to the area  Update if not starting to improve in a week or if worsening

## 2014-05-17 ENCOUNTER — Ambulatory Visit: Payer: BC Managed Care – PPO | Admitting: Family Medicine

## 2014-05-23 ENCOUNTER — Encounter: Payer: Self-pay | Admitting: Family Medicine

## 2014-05-23 ENCOUNTER — Ambulatory Visit (INDEPENDENT_AMBULATORY_CARE_PROVIDER_SITE_OTHER): Payer: BC Managed Care – PPO | Admitting: Family Medicine

## 2014-05-23 VITALS — BP 140/82 | HR 99 | Temp 97.8°F | Ht 65.0 in | Wt 185.0 lb

## 2014-05-23 DIAGNOSIS — L940 Localized scleroderma [morphea]: Secondary | ICD-10-CM

## 2014-05-23 DIAGNOSIS — N904 Leukoplakia of vulva: Secondary | ICD-10-CM

## 2014-05-23 NOTE — Assessment & Plan Note (Signed)
Much improved -with nl exam after 2wk of temovate cream  Will continue twice weekly for 2 weeks and then stop it - will update if symptoms return and we will follow closely Pt has PE planned in the fall

## 2014-05-23 NOTE — Patient Instructions (Signed)
I'm glad you are doing so much better  Vulvar exam is much better  Use the cream about 2 times per week for 2 weeks and then see how you do without it  If symptoms return let me know We will re check in the fall

## 2014-05-23 NOTE — Progress Notes (Signed)
Subjective:    Patient ID: Katrina Smith, female    DOB: [birth year], 63 y.o.   MRN: [id number]  HPI Here for f/u of lichen sclerosis - (suspect) Temovate cream -no problems  Worked well - felt relief almost immediately- all symptoms are gone   No vaginal itch/burn/pain Thinks the ulceration is healed   Patient Active Problem List   Diagnosis Date Noted  . Lichen sclerosus of female genitalia 04/27/2014  . Colon cancer screening 02/17/2013  . Encounter for routine gynecological examination 02/17/2013  . Other screening mammogram 09/25/2012  . Routine general medical examination at a health care facility 09/01/2012  . Allergic rhinitis   . Toxemia in pregnancy   . Chronic sinusitis   . OBESITY 05/27/2008  . MOTION SICKNESS 07/08/2007  . Type II or unspecified type diabetes mellitus without mention of complication, not stated as uncontrolled 07/07/2007  . HYPERCHOLESTEROLEMIA, PURE 07/07/2007  . HYPERTENSION 07/07/2007  . ALLERGIC RHINITIS 07/07/2007  . ASTHMA 07/07/2007   Past Medical History  Diagnosis Date  . Allergic rhinitis   . Asthma     mild  . Diabetes mellitus type II   . HTN, white coat   . Toxemia in pregnancy     x 2  . Chronic sinusitis     had nasal surgery   Past Surgical History  Procedure Laterality Date  . Cesarean section      x2  . Cholecystectomy  10/2004    gallstones  . Nasal sinus surgery  02/2008   History  Substance Use Topics  . Smoking status: Never Smoker   . Smokeless tobacco: Not on file  . Alcohol Use: No   Family History  Problem Relation Age of Onset  . Hypertension Father   . Diabetes Mother   . Dementia Mother   . Heart disease Mother   . Rheum arthritis Brother   . Cancer Brother     kidney   Allergies  Allergen Reactions  . Other     Poppy seeds  . Xopenex [Levalbuterol] Other (See Comments)    Throat closed up.   Current Outpatient Prescriptions on File Prior to Visit  Medication Sig Dispense Refill  .  ACCU-CHEK COMPACT PLUS test strip USE AS INSTRUCTED  102 each  3  . albuterol (PROAIR HFA) 108 (90 BASE) MCG/ACT inhaler Inhale 2 puffs into the lungs every 6 (six) hours as needed.        Marland Kitchen amLODipine-atorvastatin (CADUET) 5-10 MG per tablet TAKE 1 TABLET BY MOUTH EVERY DAY  30 tablet  5  . aspirin EC 81 MG tablet Take 81 mg by mouth daily.        . beclomethasone (QVAR) 40 MCG/ACT inhaler Inhale 2 puffs into the lungs as needed.      . cetirizine (ZYRTEC) 10 MG tablet Take 10 mg by mouth daily.       . clobetasol cream (TEMOVATE) 9.62 % Apply 1 application topically 2 (two) times daily. Apply to outer vulvar area on both sides  30 g  1  . fluticasone (FLONASE) 50 MCG/ACT nasal spray 2 sprays by Nasal route daily.        Marland Kitchen glipiZIDE (GLIPIZIDE XL) 5 MG 24 hr tablet TAKE 1 TABLET DAILY  90 tablet  3  . glucose blood test strip 1 each by Other route as directed. Use as instructed       . ketoconazole (NIZORAL) 2 % cream Apply 1 application topically as needed.       Marland Kitchen  losartan-hydrochlorothiazide (HYZAAR) 100-25 MG per tablet TAKE 1 TABLET DAILY  90 tablet  3  . metFORMIN (GLUCOPHAGE) 1000 MG tablet Take 1 tablet (1,000 mg total) by mouth 2 (two) times daily with a meal.  180 tablet  3  . montelukast (SINGULAIR) 10 MG tablet TAKE 1 TABLET AT BEDTIME  90 tablet  1  . naphazoline-pheniramine (NAPHCON-A) 0.025-0.3 % ophthalmic solution Place 1 drop into both eyes 4 (four) times daily as needed.        . Omega-3 Fatty Acids (FISH OIL CONCENTRATE PO) Take 1 capsule by mouth 2 (two) times daily.         No current facility-administered medications on file prior to visit.    Review of Systems Review of Systems  Constitutional: Negative for fever, appetite change, fatigue and unexpected weight change.  Eyes: Negative for pain and visual disturbance.  Respiratory: Negative for cough and shortness of breath.   Cardiovascular: Negative for cp or palpitations    Gastrointestinal: Negative for nausea,  diarrhea and constipation.  Genitourinary: Negative for urgency and frequency.  Skin: Negative for pallor or rash   Neurological: Negative for weakness, light-headedness, numbness and headaches.  Hematological: Negative for adenopathy. Does not bruise/bleed easily.  Psychiatric/Behavioral: Negative for dysphoric mood. The patient is not nervous/anxious.         Objective:   Physical Exam  Constitutional: She appears well-developed and well-nourished. No distress.  obese and well appearing   Eyes: Conjunctivae and EOM are normal. Pupils are equal, round, and reactive to light.  Neck: Normal range of motion.  Cardiovascular: Normal rate and regular rhythm.   Genitourinary: There is no rash, tenderness or lesion on the right labia. There is no rash, tenderness or lesion on the left labia. No erythema or tenderness around the vagina. No vaginal discharge found.  Labia are no longer inflamed with full resolution of white plaques and full healing of prev ulcer No discharge or tenderness   Lymphadenopathy:    She has no cervical adenopathy.  Neurological: She is alert.  Skin: Skin is warm and dry. No rash noted. No erythema. No pallor.  Psychiatric: She has a normal mood and affect.          Assessment & Plan:   Problem List Items Addressed This Visit     Musculoskeletal and Integument   Lichen sclerosus of female genitalia - Primary     Much improved -with nl exam after 2wk of temovate cream  Will continue twice weekly for 2 weeks and then stop it - will update if symptoms return and we will follow closely Pt has PE planned in the fall

## 2014-05-23 NOTE — Progress Notes (Signed)
Pre visit review using our clinic review tool, if applicable. No additional management support is needed unless otherwise documented below in the visit note. 

## 2014-06-13 ENCOUNTER — Encounter: Payer: Self-pay | Admitting: Family Medicine

## 2014-06-14 ENCOUNTER — Other Ambulatory Visit: Payer: Self-pay | Admitting: Family Medicine

## 2014-07-09 ENCOUNTER — Encounter: Payer: Self-pay | Admitting: Family Medicine

## 2014-07-11 ENCOUNTER — Telehealth: Payer: Self-pay | Admitting: Family Medicine

## 2014-07-11 MED ORDER — MONTELUKAST SODIUM 10 MG PO TABS: ORAL_TABLET | ORAL | Status: DC

## 2014-07-11 NOTE — Telephone Encounter (Signed)
Pt needs px for singulair sent to [facility]

## 2014-08-26 ENCOUNTER — Other Ambulatory Visit: Payer: Self-pay | Admitting: *Deleted

## 2014-08-26 MED ORDER — AMLODIPINE-ATORVASTATIN 5-10 MG PO TABS
ORAL_TABLET | ORAL | Status: DC
Start: 2014-08-26 — End: 2014-09-28

## 2014-09-05 ENCOUNTER — Telehealth: Payer: Self-pay | Admitting: Family Medicine

## 2014-09-05 DIAGNOSIS — E119 Type 2 diabetes mellitus without complications: Secondary | ICD-10-CM

## 2014-09-05 DIAGNOSIS — Z0000 Encounter for general adult medical examination without abnormal findings: Secondary | ICD-10-CM

## 2014-09-05 NOTE — Telephone Encounter (Signed)
Message copied by Abner Greenspan on Mon Sep 05, 2014  9:37 PM ------      Message from: Ellamae Sia      Created: Thu Sep 01, 2014 11:56 AM      Regarding: Lab orders for Tuesday, 9.22.15       Patient is scheduled for CPX labs, please order future labs, Thanks , Terri       ------

## 2014-09-06 ENCOUNTER — Other Ambulatory Visit (INDEPENDENT_AMBULATORY_CARE_PROVIDER_SITE_OTHER): Payer: BC Managed Care – PPO

## 2014-09-06 DIAGNOSIS — Z0000 Encounter for general adult medical examination without abnormal findings: Secondary | ICD-10-CM

## 2014-09-06 DIAGNOSIS — E119 Type 2 diabetes mellitus without complications: Secondary | ICD-10-CM

## 2014-09-06 LAB — CBC WITH DIFFERENTIAL/PLATELET
Basophils Absolute: 0 10*3/uL (ref 0.0–0.1)
Basophils Relative: 0.2 % (ref 0.0–3.0)
EOS PCT: 6.7 % — AB (ref 0.0–5.0)
Eosinophils Absolute: 0.8 10*3/uL — ABNORMAL HIGH (ref 0.0–0.7)
HEMATOCRIT: 39.1 % (ref 36.0–46.0)
HEMOGLOBIN: 13 g/dL (ref 12.0–15.0)
LYMPHS ABS: 3.8 10*3/uL (ref 0.7–4.0)
Lymphocytes Relative: 32.1 % (ref 12.0–46.0)
MCHC: 33.2 g/dL (ref 30.0–36.0)
MCV: 91.4 fl (ref 78.0–100.0)
Monocytes Absolute: 0.7 10*3/uL (ref 0.1–1.0)
Monocytes Relative: 6.2 % (ref 3.0–12.0)
NEUTROS ABS: 6.5 10*3/uL (ref 1.4–7.7)
Neutrophils Relative %: 54.8 % (ref 43.0–77.0)
Platelets: 358 10*3/uL (ref 150.0–400.0)
RBC: 4.28 Mil/uL (ref 3.87–5.11)
RDW: 13.4 % (ref 11.5–15.5)
WBC: 11.8 10*3/uL — AB (ref 4.0–10.5)

## 2014-09-06 LAB — LIPID PANEL
Cholesterol: 152 mg/dL (ref 0–200)
HDL: 35.1 mg/dL — ABNORMAL LOW (ref >39.00)
LDL Cholesterol: 94 mg/dL (ref 0–99)
NONHDL: 116.9
TRIGLYCERIDES: 117 mg/dL (ref 0.0–149.0)
Total CHOL/HDL Ratio: 4
VLDL: 23.4 mg/dL (ref 0.0–40.0)

## 2014-09-06 LAB — COMPREHENSIVE METABOLIC PANEL
ALT: 23 U/L (ref 0–35)
AST: 19 U/L (ref 0–37)
Albumin: 4.3 g/dL (ref 3.5–5.2)
Alkaline Phosphatase: 81 U/L (ref 39–117)
BILIRUBIN TOTAL: 0.4 mg/dL (ref 0.2–1.2)
BUN: 8 mg/dL (ref 6–23)
CO2: 26 meq/L (ref 19–32)
Calcium: 9.4 mg/dL (ref 8.4–10.5)
Chloride: 99 mEq/L (ref 96–112)
Creatinine, Ser: 0.5 mg/dL (ref 0.4–1.2)
GFR: 142.56 mL/min (ref >60.00)
Glucose, Bld: 140 mg/dL — ABNORMAL HIGH (ref 70–99)
Potassium: 5.2 mEq/L — ABNORMAL HIGH (ref 3.5–5.1)
Sodium: 135 mEq/L (ref 135–145)
TOTAL PROTEIN: 7.5 g/dL (ref 6.0–8.3)

## 2014-09-06 LAB — HEMOGLOBIN A1C: Hgb A1c MFr Bld: 7.3 % — ABNORMAL HIGH (ref 4.6–6.5)

## 2014-09-06 LAB — TSH: TSH: 2.17 u[IU]/mL (ref 0.35–4.50)

## 2014-09-13 ENCOUNTER — Encounter: Payer: Self-pay | Admitting: Family Medicine

## 2014-09-13 ENCOUNTER — Ambulatory Visit (INDEPENDENT_AMBULATORY_CARE_PROVIDER_SITE_OTHER): Payer: BC Managed Care – PPO | Admitting: Family Medicine

## 2014-09-13 VITALS — BP 156/94 | HR 113 | Temp 98.5°F | Ht 65.0 in | Wt 183.5 lb

## 2014-09-13 DIAGNOSIS — E7800 Pure hypercholesterolemia, unspecified: Secondary | ICD-10-CM

## 2014-09-13 DIAGNOSIS — J019 Acute sinusitis, unspecified: Secondary | ICD-10-CM | POA: Insufficient documentation

## 2014-09-13 DIAGNOSIS — Z0000 Encounter for general adult medical examination without abnormal findings: Secondary | ICD-10-CM

## 2014-09-13 DIAGNOSIS — Z1211 Encounter for screening for malignant neoplasm of colon: Secondary | ICD-10-CM

## 2014-09-13 DIAGNOSIS — J0100 Acute maxillary sinusitis, unspecified: Secondary | ICD-10-CM

## 2014-09-13 DIAGNOSIS — Z23 Encounter for immunization: Secondary | ICD-10-CM

## 2014-09-13 DIAGNOSIS — E669 Obesity, unspecified: Secondary | ICD-10-CM

## 2014-09-13 DIAGNOSIS — E119 Type 2 diabetes mellitus without complications: Secondary | ICD-10-CM

## 2014-09-13 DIAGNOSIS — J0101 Acute recurrent maxillary sinusitis: Secondary | ICD-10-CM

## 2014-09-13 DIAGNOSIS — E875 Hyperkalemia: Secondary | ICD-10-CM

## 2014-09-13 DIAGNOSIS — I10 Essential (primary) hypertension: Secondary | ICD-10-CM

## 2014-09-13 MED ORDER — AMOXICILLIN-POT CLAVULANATE 875-125 MG PO TABS: 1.0000 | ORAL_TABLET | Freq: Two times a day (BID) | ORAL | Status: DC

## 2014-09-13 NOTE — Progress Notes (Signed)
Pre visit review using our clinic review tool, if applicable. No additional management support is needed unless otherwise documented below in the visit note. 

## 2014-09-13 NOTE — Patient Instructions (Signed)
Follow up in the next month or so  Bring glucose readings from different times of day - before and after eating -so we can figure out what time of day it is going up  We will re check bp  If you are interested in a shingles/zoster vaccine - call your insurance to check on coverage,( you should not get it within 1 month of other vaccines) , then call us for a prescription  for it to take to a pharmacy that gives the shot , or make a nurse visit to get it here depending on your coverage  flu shot today  Call [facility] to make your mammogram appt Get an eye doctor appt. Take augmentin for a sinus infection   When you return- we will re check labs- but you do not have to fast   Do stool card for colon cancer screening

## 2014-09-13 NOTE — Progress Notes (Signed)
Subjective:    Patient ID: Katrina Smith, female    DOB: 03/18/1951, 63 y.o.   MRN: [id number]  HPI Here for health maintenance exam and to review chronic medical problems    Feeling great overall  Is stressed out about her labs    Wt is down 2 lb with bmi of 30  Colon cancer screen-not interested in colonosc/ will do IFOB   Zoster status- has not called ins about coverage - wants to get it   Flu vaccine -will get that today   Td 6/09  Mammogram 10/14 per pt - is due in oct  Self exam- no lumps   Pap 3/14 No gyn problems except dryness   (? Lichen sclerosis in the past)- ha snot used clobetasol recently  No bleeding   ptx 6/13  bp is up today - she is very stressed and concerned about labs  No cp or palpitations or headaches or edema  No side effects to medicines  BP Readings from Last 3 Encounters:  09/13/14 156/94  05/23/14 140/82  04/27/14 134/78       Chemistry      Component Value Date/Time   NA 135 09/06/2014 0816   K 5.2* 09/06/2014 0816   CL 99 09/06/2014 0816   CO2 26 09/06/2014 0816   BUN 8 09/06/2014 0816   CREATININE 0.5 09/06/2014 0816      Component Value Date/Time   CALCIUM 9.4 09/06/2014 0816   ALKPHOS 81 09/06/2014 0816   AST 19 09/06/2014 0816   ALT 23 09/06/2014 0816   BILITOT 0.4 09/06/2014 0816     K is high- poss from [facility] - she is not on any vit with K   Hyperlipidemia  caduet and diet Lab Results  Component Value Date   CHOL 152 09/06/2014   CHOL 126 02/10/2013   CHOL 161 09/17/2012   Lab Results  Component Value Date   HDL 35.10* 09/06/2014   HDL 28.90* 02/10/2013   HDL 36.60* 09/17/2012   Lab Results  Component Value Date   LDLCALC 94 09/06/2014   LDLCALC 75 02/10/2013   LDLCALC 103* 09/17/2012   Lab Results  Component Value Date   TRIG 117.0 09/06/2014   TRIG 111.0 02/10/2013   TRIG 105.0 09/17/2012   Lab Results  Component Value Date   CHOLHDL 4 09/06/2014   CHOLHDL 4 02/10/2013   CHOLHDL 4 09/17/2012   Lab Results  Component  Value Date   LDLDIRECT 166.9 09/28/2010   looking better overall   Cbc Lab Results  Component Value Date   WBC 11.8* 09/06/2014   HGB 13.0 09/06/2014   HCT 39.1 09/06/2014   MCV 91.4 09/06/2014   PLT 358.0 09/06/2014   wbc mildly elevated  She is having sinus symptoms - a lot of pain on R side - under the eye  Has hx of polyps and surgery  No purulent discharge  No recent steroids    DM Lab Results  Component Value Date   HGBA1C 7.3* 09/06/2014   This is up from 7.1 She is watching her sugar - if it goes up it is usually lunchtime (but most of her readings are in the am)  She is eating a DM diet  Exercises regularly - walks every day - about 30 minutes (? Not as brisk as it should be) - she will have a new place to work out soon after she moves She comes back here at least once per month  Eye exam- getting that scheduled    Patient Active Problem List   Diagnosis Date Noted  . Sinusitis, acute 09/13/2014  . Lichen sclerosus of female genitalia 04/27/2014  . Colon cancer screening 02/17/2013  . Encounter for routine gynecological examination 02/17/2013  . Other screening mammogram 09/25/2012  . Routine general medical examination at a health care facility 09/01/2012  . Allergic rhinitis   . Toxemia in pregnancy   . Chronic sinusitis   . OBESITY 05/27/2008  . MOTION SICKNESS 07/08/2007  . Type II or unspecified type diabetes mellitus without mention of complication, not stated as uncontrolled 07/07/2007  . HYPERCHOLESTEROLEMIA, PURE 07/07/2007  . HYPERTENSION 07/07/2007  . ALLERGIC RHINITIS 07/07/2007  . ASTHMA 07/07/2007   Past Medical History  Diagnosis Date  . Allergic rhinitis   . Asthma     mild  . Diabetes mellitus type II   . HTN, white coat   . Toxemia in pregnancy     x 2  . Chronic sinusitis     had nasal surgery   Past Surgical History  Procedure Laterality Date  . Cesarean section      x2  . Cholecystectomy  10/2004    gallstones  . Nasal sinus  surgery  02/2008   History  Substance Use Topics  . Smoking status: Never Smoker   . Smokeless tobacco: Not on file  . Alcohol Use: No   Family History  Problem Relation Age of Onset  . Hypertension Father   . Diabetes Mother   . Dementia Mother   . Heart disease Mother   . Rheum arthritis Brother   . Cancer Brother     kidney   Allergies  Allergen Reactions  . Other     Poppy seeds  . Xopenex [Levalbuterol] Other (See Comments)    Throat closed up.   Current Outpatient Prescriptions on File Prior to Visit  Medication Sig Dispense Refill  . ACCU-CHEK COMPACT PLUS test strip USE AS INSTRUCTED  102 each  3  . albuterol (PROAIR HFA) 108 (90 BASE) MCG/ACT inhaler Inhale 2 puffs into the lungs every 6 (six) hours as needed.        Marland Kitchen amLODipine-atorvastatin (CADUET) 5-10 MG per tablet TAKE 1 TABLET BY MOUTH EVERY DAY  30 tablet  0  . aspirin EC 81 MG tablet Take 81 mg by mouth daily.        . beclomethasone (QVAR) 40 MCG/ACT inhaler Inhale 2 puffs into the lungs as needed.      . cetirizine (ZYRTEC) 10 MG tablet Take 10 mg by mouth daily.       . clobetasol cream (TEMOVATE) 5.40 % Apply 1 application topically 2 (two) times daily. Apply to outer vulvar area on both sides  30 g  1  . fluticasone (FLONASE) 50 MCG/ACT nasal spray 2 sprays by Nasal route daily.        Marland Kitchen glipiZIDE (GLIPIZIDE XL) 5 MG 24 hr tablet TAKE 1 TABLET DAILY  90 tablet  3  . glucose blood test strip 1 each by Other route as directed. Use as instructed       . losartan-hydrochlorothiazide (HYZAAR) 100-25 MG per tablet TAKE 1 TABLET DAILY  90 tablet  3  . metFORMIN (GLUCOPHAGE) 1000 MG tablet TAKE 1 TABLET TWICE A DAY WITH MEALS  180 tablet  1  . montelukast (SINGULAIR) 10 MG tablet TAKE 1 TABLET AT BEDTIME  90 tablet  3  . naphazoline-pheniramine (NAPHCON-A) 0.025-0.3 % ophthalmic solution Place  1 drop into both eyes 4 (four) times daily as needed.         No current facility-administered medications on file  prior to visit.    Review of Systems Review of Systems  Constitutional: Negative for fever, appetite change,  and unexpected weight change.  ENT pos for R maxillary sinus pressure and pain with chronic sinusitis and congestion  Eyes: Negative for pain and visual disturbance.  Respiratory: Negative for cough and shortness of breath.   Cardiovascular: Negative for cp or palpitations    Gastrointestinal: Negative for nausea, diarrhea and constipation.  Genitourinary: Negative for urgency and frequency. neg for excessive thirst  Skin: Negative for pallor or rash   Neurological: Negative for weakness, light-headedness, numbness and headaches.  Hematological: Negative for adenopathy. Does not bruise/bleed easily.  Psychiatric/Behavioral: Negative for dysphoric mood. The patient is not nervous/anxious.         Objective:   Physical Exam  Constitutional: She appears well-developed and well-nourished. No distress.  obese and well appearing   HENT:  Head: Normocephalic and atraumatic.  Right Ear: External ear normal.  Left Ear: External ear normal.  Mouth/Throat: Oropharynx is clear and moist.  Nares are injected and congested    R maxillary sinus is tender  Throat clear   Eyes: Conjunctivae and EOM are normal. Pupils are equal, round, and reactive to light. No scleral icterus.  Neck: Normal range of motion. Neck supple. No JVD present. Carotid bruit is not present. No thyromegaly present.  Cardiovascular: Normal rate, regular rhythm, normal heart sounds and intact distal pulses.  Exam reveals no gallop.   Pulmonary/Chest: Effort normal and breath sounds normal. No respiratory distress. She has no wheezes. She exhibits no tenderness.  Abdominal: Soft. Bowel sounds are normal. She exhibits no distension, no abdominal bruit and no mass. There is no tenderness.  Genitourinary: No breast swelling, tenderness, discharge or bleeding. There is no rash, tenderness, lesion or injury on the right  labia. There is no rash, tenderness, lesion or injury on the left labia. No erythema, tenderness or bleeding around the vagina. No vaginal discharge found.  Breast exam: No mass, nodules, thickening, tenderness, bulging, retraction, inflamation, nipple discharge or skin changes noted.  No axillary or clavicular LA.      Some vaginal atrophy  No signs of lichen sclerosis today  Musculoskeletal: Normal range of motion. She exhibits no edema and no tenderness.  Lymphadenopathy:    She has no cervical adenopathy.  Neurological: She is alert. She has normal reflexes. No cranial nerve deficit. She exhibits normal muscle tone. Coordination normal.  Skin: Skin is warm and dry. No rash noted. No erythema. No pallor.  Psychiatric: She has a normal mood and affect.          Assessment & Plan:   Problem List Items Addressed This Visit     Cardiovascular and Mediastinum   HYPERTENSION - Primary     bp is up today Per pt from anxiety about labs and visit (visibly anxious) Will check again in 3 mo  Good health habits Enc wt loss Lab reviewed       Respiratory   Sinusitis, acute     R maxillary  tx with augmentin  May need sinus surgery again in the future  This is likely why wbc is elevated Re check this at f/u    Relevant Medications      amoxicillin-clavulanate (AUGMENTIN) tablet 875-125 mg     Endocrine   Type II or unspecified  type diabetes mellitus without mention of complication, not stated as uncontrolled      Lab Results  Component Value Date   HGBA1C 7.3* 09/06/2014   This is up Rev DM diet /exercise and need for wt loss  Pt thinks she can bring it down with more effort Will begin checking glucose more times per day - to see when it is peaking  F/u with glucose log       Other   HYPERCHOLESTEROLEMIA, PURE     Lipids are fairly controlled with caduet and diet  Disc goals for lipids and reasons to control them Rev labs with pt Rev low sat fat diet in detail      OBESITY     Discussed how this problem influences overall health and the risks it imposes  Reviewed plan for weight loss with lower calorie diet (via better food choices and also portion control or program like weight watchers) and exercise building up to or more than 30 minutes 5 days per week including some aerobic activity       Routine general medical examination at a health care facility     Reviewed health habits including diet and exercise and skin cancer prevention Reviewed appropriate screening tests for age  Also reviewed health mt list, fam hx and immunization status , as well as social and family history   See HPI F/u vaccine today Labs reviewed     Colon cancer screening     D/w patient [id number] for colon cancer screening, including IFOB vs. colonoscopy.  Risks and benefits of both were discussed and patient voiced understanding.  Pt elects for: IFOB card     Relevant Orders      Fecal occult blood, imunochemical    Other Visit Diagnoses   Need for prophylactic vaccination and inoculation against influenza        Relevant Orders       Flu Vaccine QUAD 36+ mos PF IM (Fluarix Quad PF) (Completed)

## 2014-09-14 DIAGNOSIS — E875 Hyperkalemia: Secondary | ICD-10-CM | POA: Insufficient documentation

## 2014-09-14 NOTE — Assessment & Plan Note (Signed)
Lab Results  Component Value Date   HGBA1C 7.3* 09/06/2014   This is up Rev DM diet /exercise and need for wt loss  Pt thinks she can bring it down with more effort Will begin checking glucose more times per day - to see when it is peaking  F/u with glucose log

## 2014-09-14 NOTE — Assessment & Plan Note (Signed)
bp is up today Per pt from anxiety about labs and visit (visibly anxious) Will check again in 3 mo  Good health habits Enc wt loss Lab reviewed

## 2014-09-14 NOTE — Assessment & Plan Note (Signed)
Reviewed health habits including diet and exercise and skin cancer prevention Reviewed appropriate screening tests for age  Also reviewed health mt list, fam hx and immunization status , as well as social and family history   See HPI F/u vaccine today Labs reviewed

## 2014-09-14 NOTE — Assessment & Plan Note (Signed)
D/w patient re:options for colon cancer screening, including IFOB vs. colonoscopy.  Risks and benefits of both were discussed and patient voiced understanding.  Pt elects for:IFOB card  

## 2014-09-14 NOTE — Assessment & Plan Note (Signed)
Discussed how this problem influences overall health and the risks it imposes  Reviewed plan for weight loss with lower calorie diet (via better food choices and also portion control or program like weight watchers) and exercise building up to or more than 30 minutes 5 days per week including some aerobic activity    

## 2014-09-14 NOTE — Assessment & Plan Note (Signed)
Lab Results  Component Value Date   K 5.2* 09/06/2014   Not sure of cause  Will re check at f/u  Renal fxn seems ok  No supplements

## 2014-09-14 NOTE — Assessment & Plan Note (Signed)
R maxillary  tx with augmentin  May need sinus surgery again in the future  This is likely why wbc is elevated Re check this at f/u

## 2014-09-14 NOTE — Assessment & Plan Note (Signed)
Lipids are fairly controlled with caduet and diet  Disc goals for lipids and reasons to control them Rev labs with pt Rev low sat fat diet in detail

## 2014-09-27 ENCOUNTER — Other Ambulatory Visit (INDEPENDENT_AMBULATORY_CARE_PROVIDER_SITE_OTHER): Payer: BC Managed Care – PPO

## 2014-09-27 DIAGNOSIS — Z1211 Encounter for screening for malignant neoplasm of colon: Secondary | ICD-10-CM

## 2014-09-27 LAB — FECAL OCCULT BLOOD, IMMUNOCHEMICAL: Fecal Occult Bld: POSITIVE — AB

## 2014-09-28 ENCOUNTER — Encounter: Payer: Self-pay | Admitting: Family Medicine

## 2014-09-28 ENCOUNTER — Telehealth: Payer: Self-pay | Admitting: Family Medicine

## 2014-09-28 ENCOUNTER — Other Ambulatory Visit: Payer: Self-pay | Admitting: Family Medicine

## 2014-09-28 DIAGNOSIS — R195 Other fecal abnormalities: Secondary | ICD-10-CM | POA: Insufficient documentation

## 2014-09-28 NOTE — Telephone Encounter (Signed)
Pt has pos ifob- will refer for colonoscopy  She lives in [place] but wants to come out here for this

## 2014-09-30 ENCOUNTER — Other Ambulatory Visit: Payer: Self-pay | Admitting: Family Medicine

## 2014-10-05 ENCOUNTER — Encounter: Payer: Self-pay | Admitting: Family Medicine

## 2014-10-07 ENCOUNTER — Encounter: Payer: Self-pay | Admitting: Internal Medicine

## 2014-10-28 ENCOUNTER — Ambulatory Visit (AMBULATORY_SURGERY_CENTER): Payer: Self-pay | Admitting: *Deleted

## 2014-10-28 VITALS — Ht 65.0 in | Wt 183.0 lb

## 2014-10-28 DIAGNOSIS — Z1211 Encounter for screening for malignant neoplasm of colon: Secondary | ICD-10-CM

## 2014-10-28 DIAGNOSIS — R195 Other fecal abnormalities: Secondary | ICD-10-CM

## 2014-10-28 MED ORDER — MOVIPREP 100 G PO SOLR: ORAL | Status: DC

## 2014-10-28 NOTE — Progress Notes (Signed)
No egg or soy allergy  Pt states "local anesthetics wear off quickly."  No diet medications taken  Registered in [facility]

## 2014-11-18 ENCOUNTER — Other Ambulatory Visit: Payer: Self-pay | Admitting: Family Medicine

## 2014-11-21 ENCOUNTER — Ambulatory Visit (AMBULATORY_SURGERY_CENTER): Payer: BC Managed Care – PPO | Admitting: Internal Medicine

## 2014-11-21 ENCOUNTER — Encounter: Payer: Self-pay | Admitting: Internal Medicine

## 2014-11-21 VITALS — BP 145/86 | HR 103 | Temp 98.3°F | Resp 14 | Ht 65.0 in | Wt 183.0 lb

## 2014-11-21 DIAGNOSIS — D122 Benign neoplasm of ascending colon: Secondary | ICD-10-CM

## 2014-11-21 DIAGNOSIS — Z1211 Encounter for screening for malignant neoplasm of colon: Secondary | ICD-10-CM

## 2014-11-21 DIAGNOSIS — D120 Benign neoplasm of cecum: Secondary | ICD-10-CM

## 2014-11-21 DIAGNOSIS — D123 Benign neoplasm of transverse colon: Secondary | ICD-10-CM

## 2014-11-21 DIAGNOSIS — D128 Benign neoplasm of rectum: Secondary | ICD-10-CM

## 2014-11-21 DIAGNOSIS — D129 Benign neoplasm of anus and anal canal: Secondary | ICD-10-CM

## 2014-11-21 MED ORDER — SODIUM CHLORIDE 0.9 % IV SOLN: 500.0000 mL | INTRAVENOUS | Status: DC

## 2014-11-21 MED ORDER — AMOXICILLIN-POT CLAVULANATE 875-125 MG PO TABS: 1.0000 | ORAL_TABLET | Freq: Two times a day (BID) | ORAL | Status: DC

## 2014-11-21 NOTE — Progress Notes (Signed)
1409 started sedation Dr Hilarie Fredrickson in room and ready...before scope in pt vomitted..the patient immediately put in trendelenburg and suctioned with yaunker..O2 turned up high and airway cleaned and supported until pt awake and talking..vital signs documented by the minute in chart there was a very brief period of de saturation....there was only one period of vomitting the botom of the canister was not even covered although there was emeis on bed sheets .Marland Kitchen After giving patient ample time to wake up and understand the situation she was very adimate about not wanting to re prep  She was willing to proceded without deep sedation.  Pros and cons of moderate sedation were discussed.  Decided to proceed with very light sedation (1mg  versed 56mcg fentayl)  The very light sedation was given and pt response was evaluated before we proceded.  Pt could not tell she had anything and still wanted to proceed.   Pt awake alert oriented x4 through out talking and watching procedure and asking questions.  Pt even moved herself to supine and right lateral to help get to cecum

## 2014-11-21 NOTE — Op Note (Signed)
[place]  [address] [place], [address]   COLONOSCOPY PROCEDURE REPORT  PATIENT: Katrina Smith, Katrina Smith  MR#: [id number] BIRTHDATE: 20-Nov-1951 , 68  yrs. old GENDER: female ENDOSCOPIST: Jerene Bears, MD REFERRED ME:QASTM Vernell Morgans, M.D. PROCEDURE DATE:  11/21/2014 PROCEDURE:   Colonoscopy with snare polypectomy and Colonoscopy with cold biopsy polypectomy First Screening Colonoscopy - Avg.  risk and is 50 yrs.  old or older Yes.  Prior Negative Screening - Now for repeat screening. N/A  History of Adenoma - Now for follow-up colonoscopy & has been > or = to 3 yrs.  N/A  Polyps Removed Today? Yes. ASA CLASS:   Class III INDICATIONS:average risk for colon cancer and first colonoscopy. MEDICATIONS: Monitored anesthesia care, Propofol 120 mg IV, Fentanyl 25 mcg IV, Versed 1 mg IV, and esmolol 100 mg IV  DESCRIPTION OF PROCEDURE:   After the risks benefits and alternatives of the procedure were thoroughly explained, informed consent was obtained.  The digital rectal exam revealed no rectal mass.   The LB [id number]  endoscope was introduced through the anus and advanced to the cecum, which was identified by both the appendix and ileocecal valve. No adverse events experienced. The quality of the prep was good, using MoviPrep  The instrument was then slowly withdrawn as the colon was fully examined.    COLON FINDINGS: Six sessile polyps ranging from 3 to 42mm in size were found in the ascending colon (1), at the cecum (1), in the transverse colon (2), and rectum (1).  Polypectomies were performed with a cold snare  (4)and with cold forceps (2).  The resection was complete, the polyp tissue was completely retrieved and sent to histology.   There was mild diverticulosis noted in the sigmoid colon.  Retroflexed views revealed internal hemorrhoids. The time to cecum=22 minutes 56 seconds.  Withdrawal time=22 minutes 22 seconds.  The scope was withdrawn and the procedure  completed.  COMPLICATIONS: Possible aspiration.  Anesthesia was induced with 120 mg of IV propofol and before rectal exam was performed the patient had an episode of vomiting with concern of aspiration.  Anesthesia was stopped and the patient had complete return of consciousness. We discussed the possible event and concern for continued deep sedation.  The patient wanted the procedure completed today given that she had been through the bowel preparation and we proceeded with very minimal moderate sedation.  The patient was awake the entire time and did well.  ENDOSCOPIC IMPRESSION: 1.   Six sessile polyps ranging from 3 to 23mm in size were found in the ascending colon, at the cecum, in the transverse colon, and rectum; polypectomies were performed with a cold snare and with cold forceps 2.   Mild diverticulosis was noted in the sigmoid colon  RECOMMENDATIONS: 1.  Await pathology results 2.  High fiber diet 3.  Timing of repeat colonoscopy will be determined by pathology findings.  For next colonoscopy recommend a longer period of NPO before sedation (6 hours) and oral metoclopramide before 2nd half of preparation. 4.  You will receive a letter within 1-2 weeks with the results of your biopsy as well as final recommendations.  Please call my office if you have not received a letter after 3 weeks.  eSigned:  Jerene Bears, MD 11/21/2014 3:26 PM   cc: The Patient and Abner Greenspan, MD   PATIENT NAME:  Katrina Smith MR#: [id number]

## 2014-11-21 NOTE — Progress Notes (Signed)
Report to PACU, RN, vss, BBS= Clear.  

## 2014-11-21 NOTE — Patient Instructions (Signed)
Discharge instructions given with verbal understanding. Handouts on polyps and diverticulosis. Resume previous medications. YOU HAD AN ENDOSCOPIC PROCEDURE TODAY AT THE [facility]: Refer to the procedure report that was given to you for any specific questions about what was found during the examination.  If the procedure report does not answer your questions, please call your gastroenterologist to clarify.  If you requested that your care partner not be given the details of your procedure findings, then the procedure report has been included in a sealed envelope for you to review at your convenience later.  YOU SHOULD EXPECT: Some feelings of bloating in the abdomen. Passage of more gas than usual.  Walking can help get rid of the air that was put into your GI tract during the procedure and reduce the bloating. If you had a lower endoscopy (such as a colonoscopy or flexible sigmoidoscopy) you may notice spotting of blood in your stool or on the toilet paper. If you underwent a bowel prep for your procedure, then you may not have a normal bowel movement for a few days.  DIET: Your first meal following the procedure should be a light meal and then it is ok to progress to your normal diet.  A half-sandwich or bowl of soup is an example of a good first meal.  Heavy or fried foods are harder to digest and may make you feel nauseous or bloated.  Likewise meals heavy in dairy and vegetables can cause extra gas to form and this can also increase the bloating.  Drink plenty of fluids but you should avoid alcoholic beverages for 24 hours.  ACTIVITY: Your care partner should take you home directly after the procedure.  You should plan to take it easy, moving slowly for the rest of the day.  You can resume normal activity the day after the procedure however you should NOT DRIVE or use heavy machinery for 24 hours (because of the sedation medicines used during the test).    SYMPTOMS TO REPORT  IMMEDIATELY: A gastroenterologist can be reached at any hour.  During normal business hours, 8:30 AM to 5:00 PM Monday through Friday, call [phone].  After hours and on weekends, please call the GI answering service at [phone] who will take a message and have the physician on call contact you.   Following lower endoscopy (colonoscopy or flexible sigmoidoscopy):  Excessive amounts of blood in the stool  Significant tenderness or worsening of abdominal pains  Swelling of the abdomen that is new, acute  Fever of 100F or higher  FOLLOW UP: If any biopsies were taken you will be contacted by phone or by letter within the next 1-3 weeks.  Call your gastroenterologist if you have not heard about the biopsies in 3 weeks.  Our staff will call the home number listed on your records the next business day following your procedure to check on you and address any questions or concerns that you may have at that time regarding the information given to you following your procedure. This is a courtesy call and so if there is no answer at the home number and we have not heard from you through the emergency physician on call, we will assume that you have returned to your regular daily activities without incident.  SIGNATURES/CONFIDENTIALITY: You and/or your care partner have signed paperwork which will be entered into your electronic medical record.  These signatures attest to the fact that that the information above on your After Visit Summary   has been reviewed and is understood.  Full responsibility of the confidentiality of this discharge information lies with you and/or your care-partner. 

## 2014-11-21 NOTE — Progress Notes (Signed)
Called to room to assist during endoscopic procedure.  Patient ID and intended procedure confirmed with present staff. Received instructions for my participation in the procedure from the performing physician.  

## 2014-11-22 ENCOUNTER — Telehealth: Payer: Self-pay

## 2014-11-22 NOTE — Telephone Encounter (Signed)
  Follow up Call-  Call back number 11/21/2014  Post procedure Call Back phone  # [phone]  Permission to leave phone message Yes     Patient questions:  Do you have a fever, pain , or abdominal swelling? No. Pain Score  0 *  Have you tolerated food without any problems? Yes.    Have you been able to return to your normal activities? Yes.    Do you have any questions about your discharge instructions: Diet   No. Medications  No. Follow up visit  No.  Do you have questions or concerns about your Care? No.  Actions: * If pain score is 4 or above: No action needed, pain <4.  No problems per the pt. maw

## 2014-11-25 ENCOUNTER — Encounter: Payer: Self-pay | Admitting: Internal Medicine

## 2014-11-30 ENCOUNTER — Other Ambulatory Visit: Payer: Self-pay | Admitting: Family Medicine

## 2014-12-23 ENCOUNTER — Ambulatory Visit: Payer: Self-pay | Admitting: Family Medicine

## 2014-12-26 ENCOUNTER — Encounter: Payer: Self-pay | Admitting: Family Medicine

## 2015-02-24 ENCOUNTER — Other Ambulatory Visit: Payer: Self-pay | Admitting: Family Medicine

## 2015-03-16 ENCOUNTER — Other Ambulatory Visit: Payer: Self-pay

## 2015-03-16 MED ORDER — AMLODIPINE-ATORVASTATIN 5-10 MG PO TABS: 1.0000 | ORAL_TABLET | Freq: Every day | ORAL | Status: DC

## 2015-05-08 ENCOUNTER — Other Ambulatory Visit: Payer: Self-pay | Admitting: Family Medicine

## 2015-05-08 NOTE — Telephone Encounter (Signed)
Please refill for 6 mo  Can schedule her PE in Oct if she wants to

## 2015-05-08 NOTE — Telephone Encounter (Signed)
Electronic refill request, last OV was a CPE on 09/13/14 and no future appt., please advise

## 2015-05-09 NOTE — Telephone Encounter (Signed)
appt scheduled and med refilled 

## 2015-05-16 ENCOUNTER — Other Ambulatory Visit: Payer: Self-pay | Admitting: Family Medicine

## 2015-05-17 ENCOUNTER — Other Ambulatory Visit: Payer: Self-pay | Admitting: Family Medicine

## 2015-06-11 ENCOUNTER — Other Ambulatory Visit: Payer: Self-pay | Admitting: Family Medicine

## 2015-09-19 ENCOUNTER — Telehealth: Payer: Self-pay | Admitting: Family Medicine

## 2015-09-19 DIAGNOSIS — E119 Type 2 diabetes mellitus without complications: Secondary | ICD-10-CM

## 2015-09-19 DIAGNOSIS — Z0000 Encounter for general adult medical examination without abnormal findings: Secondary | ICD-10-CM

## 2015-09-19 NOTE — Telephone Encounter (Signed)
-----   Message from Ellamae Sia sent at 09/14/2015  9:56 AM EDT ----- Regarding: Lab orders for Wednesday, 10.5.16 Patient is scheduled for CPX labs, please order future labs, Thanks , Karna Christmas

## 2015-09-20 ENCOUNTER — Other Ambulatory Visit (INDEPENDENT_AMBULATORY_CARE_PROVIDER_SITE_OTHER): Payer: BC Managed Care – PPO

## 2015-09-20 DIAGNOSIS — E119 Type 2 diabetes mellitus without complications: Secondary | ICD-10-CM

## 2015-09-20 DIAGNOSIS — Z0000 Encounter for general adult medical examination without abnormal findings: Secondary | ICD-10-CM

## 2015-09-20 LAB — CBC WITH DIFFERENTIAL/PLATELET
BASOS PCT: 0.6 % (ref 0.0–3.0)
Basophils Absolute: 0.1 10*3/uL (ref 0.0–0.1)
EOS PCT: 5.1 % — AB (ref 0.0–5.0)
Eosinophils Absolute: 0.6 10*3/uL (ref 0.0–0.7)
HCT: 39 % (ref 36.0–46.0)
Hemoglobin: 13 g/dL (ref 12.0–15.0)
LYMPHS ABS: 3.7 10*3/uL (ref 0.7–4.0)
Lymphocytes Relative: 32.6 % (ref 12.0–46.0)
MCHC: 33.2 g/dL (ref 30.0–36.0)
MCV: 91.6 fl (ref 78.0–100.0)
MONOS PCT: 7 % (ref 3.0–12.0)
Monocytes Absolute: 0.8 10*3/uL (ref 0.1–1.0)
NEUTROS ABS: 6.3 10*3/uL (ref 1.4–7.7)
Neutrophils Relative %: 54.7 % (ref 43.0–77.0)
PLATELETS: 382 10*3/uL (ref 150.0–400.0)
RBC: 4.26 Mil/uL (ref 3.87–5.11)
RDW: 12.9 % (ref 11.5–15.5)
WBC: 11.5 10*3/uL — ABNORMAL HIGH (ref 4.0–10.5)

## 2015-09-20 LAB — COMPREHENSIVE METABOLIC PANEL
ALT: 22 U/L (ref 0–35)
AST: 16 U/L (ref 0–37)
Albumin: 4.3 g/dL (ref 3.5–5.2)
Alkaline Phosphatase: 81 U/L (ref 39–117)
BUN: 11 mg/dL (ref 6–23)
CO2: 30 meq/L (ref 19–32)
Calcium: 9.4 mg/dL (ref 8.4–10.5)
Chloride: 96 mEq/L (ref 96–112)
Creatinine, Ser: 0.5 mg/dL (ref 0.40–1.20)
GFR: 132.3 mL/min (ref >60.00)
GLUCOSE: 91 mg/dL (ref 70–99)
POTASSIUM: 4.4 meq/L (ref 3.5–5.1)
Sodium: 133 mEq/L — ABNORMAL LOW (ref 135–145)
Total Bilirubin: 0.5 mg/dL (ref 0.2–1.2)
Total Protein: 7.1 g/dL (ref 6.0–8.3)

## 2015-09-20 LAB — HEMOGLOBIN A1C: Hgb A1c MFr Bld: 6.9 % — ABNORMAL HIGH (ref 4.6–6.5)

## 2015-09-20 LAB — LIPID PANEL
CHOL/HDL RATIO: 4
CHOLESTEROL: 152 mg/dL (ref 0–200)
HDL: 39.8 mg/dL (ref >39.00)
LDL CALC: 89 mg/dL (ref 0–99)
NonHDL: 112.1
TRIGLYCERIDES: 116 mg/dL (ref 0.0–149.0)
VLDL: 23.2 mg/dL (ref 0.0–40.0)

## 2015-09-20 LAB — TSH: TSH: 1.73 u[IU]/mL (ref 0.35–4.50)

## 2015-09-22 ENCOUNTER — Ambulatory Visit (INDEPENDENT_AMBULATORY_CARE_PROVIDER_SITE_OTHER): Payer: BC Managed Care – PPO | Admitting: Family Medicine

## 2015-09-22 ENCOUNTER — Encounter: Payer: Self-pay | Admitting: Family Medicine

## 2015-09-22 VITALS — BP 140/80 | HR 95 | Temp 98.4°F | Ht 64.75 in | Wt 181.5 lb

## 2015-09-22 DIAGNOSIS — Z23 Encounter for immunization: Secondary | ICD-10-CM

## 2015-09-22 DIAGNOSIS — Z0000 Encounter for general adult medical examination without abnormal findings: Secondary | ICD-10-CM | POA: Diagnosis not present

## 2015-09-22 DIAGNOSIS — E7800 Pure hypercholesterolemia, unspecified: Secondary | ICD-10-CM

## 2015-09-22 DIAGNOSIS — E119 Type 2 diabetes mellitus without complications: Secondary | ICD-10-CM

## 2015-09-22 DIAGNOSIS — E669 Obesity, unspecified: Secondary | ICD-10-CM

## 2015-09-22 DIAGNOSIS — I10 Essential (primary) hypertension: Secondary | ICD-10-CM | POA: Diagnosis not present

## 2015-09-22 NOTE — Progress Notes (Signed)
Pre visit review using our clinic review tool, if applicable. No additional management support is needed unless otherwise documented below in the visit note. 

## 2015-09-22 NOTE — Patient Instructions (Signed)
Take care of yourself  Flu shot today  Hep A shot today for your upcoming trip  If you are interested in a shingles/zoster vaccine - call your insurance to check on coverage,( you should not get it within 1 month of other vaccines) , then call us for a prescription  for it to take to a pharmacy that gives the shot , or make a nurse visit to get it here depending on your coverage   Keep exercising  Get your annual eye exam

## 2015-09-22 NOTE — Progress Notes (Signed)
Subjective:    Patient ID: Katrina Smith, female    DOB: 06-13-1951, 64 y.o.   MRN: [id number]  HPI Here for health maintenance exam and to review chronic medical problems    Wt is down 2 lb with bmi of 30  Always working on that   Is going to [place] for a month - to visit her daughter - in Feb -interested in Hep A vaccine   Sprained her ankle this summer -not able to walk as much-better now   Hep C and HIV screening  Is not interested   Has not had a shingles vaccine  Unsure if she had chicken pox   Pap 3/14 nl  No abn pap smears  No symptoms  Has had lichen sclerosis - much better -uses cream only once in a while   Mammogram was in jan -nl  Self exam-no lumps   PNV 6/13   Td 6/09  opth- does not have eye doctor yet   Colonoscopy 12/15 - polyps - 3 year f/u  She vomited during procedure and she did not want to re schedule - she did it without anesthesia  She did well however   bp is stable today  No cp or palpitations or headaches or edema  No side effects to medicines  BP Readings from Last 3 Encounters:  09/22/15 148/82  11/21/14 145/86  09/13/14 156/94    Has whitecoat syndrome  At home her bp is lower 130s/70    DM2 Lab Results  Component Value Date   HGBA1C 6.9* 09/20/2015   Down from 7.3 - very good!! Started doing a delivered meal service and that is fun / helps her eating - 3 times per week "hello fresh"  Cholesterol Lab Results  Component Value Date   CHOL 152 09/20/2015   CHOL 152 09/06/2014   CHOL 126 02/10/2013   Lab Results  Component Value Date   HDL 39.80 09/20/2015   HDL 35.10* 09/06/2014   HDL 28.90* 02/10/2013   Lab Results  Component Value Date   LDLCALC 89 09/20/2015   LDLCALC 94 09/06/2014   [place] 75 02/10/2013   Lab Results  Component Value Date   TRIG 116.0 09/20/2015   TRIG 117.0 09/06/2014   TRIG 111.0 02/10/2013   Lab Results  Component Value Date   CHOLHDL 4 09/20/2015   CHOLHDL 4 09/06/2014   CHOLHDL 4 02/10/2013   Lab Results  Component Value Date   LDLDIRECT 166.9 09/28/2010   Very good - HDL almost at goal   Wbc is always high Lab Results  Component Value Date   WBC 11.5* 09/20/2015   HGB 13.0 09/20/2015   HCT 39.0 09/20/2015   MCV 91.6 09/20/2015   PLT 382.0 09/20/2015   wbc is stable     Chemistry      Component Value Date/Time   NA 133* 09/20/2015 0819   K 4.4 09/20/2015 0819   CL 96 09/20/2015 0819   CO2 30 09/20/2015 0819   BUN 11 09/20/2015 0819   CREATININE 0.50 09/20/2015 0819      Component Value Date/Time   CALCIUM 9.4 09/20/2015 0819   ALKPHOS 81 09/20/2015 0819   AST 16 09/20/2015 0819   ALT 22 09/20/2015 0819   BILITOT 0.5 09/20/2015 0819      Lab Results  Component Value Date   TSH 1.73 09/20/2015     Patient Active Problem List   Diagnosis Date Noted  . Fecal occult blood test  positive 09/28/2014  . Hyperkalemia 09/14/2014  . Sinusitis, acute 09/13/2014  . Lichen sclerosus of female genitalia 04/27/2014  . Colon cancer screening 02/17/2013  . Encounter for routine gynecological examination 02/17/2013  . Other screening mammogram 09/25/2012  . Routine general medical examination at a health care facility 09/01/2012  . Allergic rhinitis   . Toxemia in pregnancy   . Chronic sinusitis   . OBESITY 05/27/2008  . MOTION SICKNESS 07/08/2007  . Diabetes type 2, controlled ([place]) 07/07/2007  . HYPERCHOLESTEROLEMIA, PURE 07/07/2007  . Essential hypertension 07/07/2007  . ALLERGIC RHINITIS 07/07/2007  . ASTHMA 07/07/2007   Past Medical History  Diagnosis Date  . Allergic rhinitis   . Asthma     mild  . Diabetes mellitus type II   . HTN, white coat   . Toxemia in pregnancy     x 2  . Chronic sinusitis     had nasal surgery  . Allergy   . GERD (gastroesophageal reflux disease)   . Hyperlipidemia   . Complication of anesthesia     local anesthetics- per pt "wear off quickly"   Past Surgical History  Procedure Laterality  Date  . Cesarean section      x2  . Cholecystectomy  10/2004    gallstones  . Nasal sinus surgery  02/2008   Social History  Substance Use Topics  . Smoking status: Never Smoker   . Smokeless tobacco: Never Used  . Alcohol Use: No   Family History  Problem Relation Age of Onset  . Hypertension Father   . Diabetes Mother   . Dementia Mother   . Heart disease Mother   . Rheum arthritis Brother   . Cancer Brother     kidney  . Colon cancer Neg Hx   . Esophageal cancer Neg Hx   . Stomach cancer Neg Hx   . Rectal cancer Neg Hx    Allergies  Allergen Reactions  . Other     Poppy seeds  . Xopenex [Levalbuterol] Other (See Comments)    Throat closed up.   Current Outpatient Prescriptions on File Prior to Visit  Medication Sig Dispense Refill  . albuterol (PROAIR HFA) 108 (90 BASE) MCG/ACT inhaler Inhale 2 puffs into the lungs every 6 (six) hours as needed.      Marland Kitchen amLODipine-atorvastatin (CADUET) 5-10 MG per tablet Take 1 tablet by mouth daily. 90 tablet 3  . aspirin EC 81 MG tablet Take 81 mg by mouth daily.      . beclomethasone (QVAR) 40 MCG/ACT inhaler Inhale 2 puffs into the lungs as needed.    . cetirizine (ZYRTEC) 10 MG tablet Take 10 mg by mouth daily.     . clobetasol cream (TEMOVATE) 7.02 % Apply 1 application topically 2 (two) times daily. Apply to outer vulvar area on both sides 30 g 1  . Famotidine (PEPCID PO) Take by mouth as needed.    . fluticasone (FLONASE) 50 MCG/ACT nasal spray 2 sprays by Nasal route daily.      Marland Kitchen GLIPIZIDE XL 5 MG 24 hr tablet TAKE 1 TABLET DAILY 90 tablet 1  . glucose blood (ACCU-CHEK COMPACT PLUS) test strip CHECK BLOOD SUGAR ONCE DAILY AND AS NEEDED FOR DM (DX. E11.9) 100 each 1  . glucose blood test strip 1 each by Other route as directed. Use as instructed     . losartan-hydrochlorothiazide (HYZAAR) 100-25 MG per tablet TAKE 1 TABLET DAILY 90 tablet 3  . metFORMIN (GLUCOPHAGE) 1000 MG tablet TAKE  1 TABLET TWICE A DAY WITH MEALS 180  tablet 1  . montelukast (SINGULAIR) 10 MG tablet TAKE 1 TABLET AT BEDTIME 90 tablet 1  . naphazoline-pheniramine (NAPHCON-A) 0.025-0.3 % ophthalmic solution Place 1 drop into both eyes 4 (four) times daily as needed.      . Omega-3 Fatty Acids (FISH OIL PO) Take by mouth. BID     No current facility-administered medications on file prior to visit.    Review of Systems Review of Systems  Constitutional: Negative for fever, appetite change, fatigue and unexpected weight change.  Eyes: Negative for pain and visual disturbance.  Respiratory: Negative for cough and shortness of breath.   Cardiovascular: Negative for cp or palpitations    Gastrointestinal: Negative for nausea, diarrhea and constipation.  Genitourinary: Negative for urgency and frequency.  Skin: Negative for pallor or rash   Neurological: Negative for weakness, light-headedness, numbness and headaches.  Hematological: Negative for adenopathy. Does not bruise/bleed easily.  Psychiatric/Behavioral: Negative for dysphoric mood. The patient is not nervous/anxious.         Objective:   Physical Exam  Constitutional: She appears well-developed and well-nourished. No distress.  obese and well appearing   HENT:  Head: Normocephalic and atraumatic.  Right Ear: External ear normal.  Left Ear: External ear normal.  Mouth/Throat: Oropharynx is clear and moist.  Eyes: Conjunctivae and EOM are normal. Pupils are equal, round, and reactive to light. No scleral icterus.  Neck: Normal range of motion. Neck supple. No JVD present. Carotid bruit is not present. No thyromegaly present.  Cardiovascular: Normal rate, regular rhythm, normal heart sounds and intact distal pulses.  Exam reveals no gallop.   Pulmonary/Chest: Effort normal and breath sounds normal. No respiratory distress. She has no wheezes. She exhibits no tenderness.  Abdominal: Soft. Bowel sounds are normal. She exhibits no distension, no abdominal bruit and no mass. There is  no tenderness.  Genitourinary: No breast swelling, tenderness, discharge or bleeding.  Breast exam: No mass, nodules, thickening, tenderness, bulging, retraction, inflamation, nipple discharge or skin changes noted.  No axillary or clavicular LA.      Musculoskeletal: Normal range of motion. She exhibits no edema or tenderness.  Lymphadenopathy:    She has no cervical adenopathy.  Neurological: She is alert. She has normal reflexes. No cranial nerve deficit. She exhibits normal muscle tone. Coordination normal.  Skin: Skin is warm and dry. No rash noted. No erythema. No pallor.  Psychiatric: She has a normal mood and affect.          Assessment & Plan:   Problem List Items Addressed This Visit      Cardiovascular and Mediastinum   Essential hypertension    bp in fair control at this time  BP Readings from Last 1 Encounters:  09/22/15 140/80   No changes needed Disc lifstyle change with low sodium diet and exercise  Labs reviewed         Endocrine   Diabetes type 2, controlled ([place])    Lab Results  Component Value Date   HGBA1C 6.9* 09/20/2015   Pt will schedule her own eye exam  Enc wt loss/low glycemic diet and exercise         Other   HYPERCHOLESTEROLEMIA, PURE    Disc goals for lipids and reasons to control them Rev labs with pt Rev low sat fat diet in detail  Fairly well controlled with caduet       OBESITY    Discussed how this problem influences  overall health and the risks it imposes  Reviewed plan for weight loss with lower calorie diet (via better food choices and also portion control or program like weight watchers) and exercise building up to or more than 30 minutes 5 days per week including some aerobic activity    Pt continues to work on it       Routine general medical examination at a health care facility    Reviewed health habits including diet and exercise and skin cancer prevention Reviewed appropriate screening tests for age  Also  reviewed health mt list, fam hx and immunization status , as well as social and family history   See HPI Labs reviewed Take care of yourself  Flu shot today  Hep A shot today for your upcoming trip  If you are interested in a shingles/zoster vaccine - call your insurance to check on coverage,( you should not get it within 1 month of other vaccines) , then call us for a prescription  for it to take to a pharmacy that gives the shot , or make a nurse visit to get it here depending on your coverage   Keep exercising  Get your annual eye exam        Other Visit Diagnoses    Need for influenza vaccination    -  Primary    Relevant Orders    Flu Vaccine QUAD 36+ mos PF IM (Fluarix & Fluzone Quad PF) (Completed)    Need for prophylactic vaccination against hepatitis A        Relevant Orders    Hepatitis A vaccine adult IM (Completed)

## 2015-09-24 NOTE — Assessment & Plan Note (Signed)
Lab Results  Component Value Date   HGBA1C 6.9* 09/20/2015   Pt will schedule her own eye exam  Enc wt loss/low glycemic diet and exercise

## 2015-09-24 NOTE — Assessment & Plan Note (Signed)
Reviewed health habits including diet and exercise and skin cancer prevention Reviewed appropriate screening tests for age  Also reviewed health mt list, fam hx and immunization status , as well as social and family history   See HPI Labs reviewed Take care of yourself  Flu shot today  Hep A shot today for your upcoming trip  If you are interested in a shingles/zoster vaccine - call your insurance to check on coverage,( you should not get it within 1 month of other vaccines) , then call us for a prescription  for it to take to a pharmacy that gives the shot , or make a nurse visit to get it here depending on your coverage   Keep exercising  Get your annual eye exam

## 2015-09-24 NOTE — Assessment & Plan Note (Signed)
Discussed how this problem influences overall health and the risks it imposes  Reviewed plan for weight loss with lower calorie diet (via better food choices and also portion control or program like weight watchers) and exercise building up to or more than 30 minutes 5 days per week including some aerobic activity    Pt continues to work on it

## 2015-09-24 NOTE — Assessment & Plan Note (Signed)
Disc goals for lipids and reasons to control them Rev labs with pt Rev low sat fat diet in detail  Fairly well controlled with caduet

## 2015-09-24 NOTE — Assessment & Plan Note (Signed)
bp in fair control at this time  BP Readings from Last 1 Encounters:  09/22/15 140/80   No changes needed Disc lifstyle change with low sodium diet and exercise  Labs reviewed

## 2015-10-15 LAB — HM DIABETES EYE EXAM

## 2015-11-12 ENCOUNTER — Other Ambulatory Visit: Payer: Self-pay | Admitting: Family Medicine

## 2015-12-07 ENCOUNTER — Other Ambulatory Visit: Payer: Self-pay | Admitting: Family Medicine

## 2015-12-25 ENCOUNTER — Encounter: Payer: Self-pay | Admitting: Family Medicine

## 2015-12-25 ENCOUNTER — Ambulatory Visit (INDEPENDENT_AMBULATORY_CARE_PROVIDER_SITE_OTHER): Payer: BC Managed Care – PPO | Admitting: Family Medicine

## 2015-12-25 VITALS — BP 150/80 | HR 108 | Temp 98.4°F | Ht 64.75 in | Wt 183.5 lb

## 2015-12-25 DIAGNOSIS — J0101 Acute recurrent maxillary sinusitis: Secondary | ICD-10-CM

## 2015-12-25 MED ORDER — PREDNISONE 10 MG PO TABS
ORAL_TABLET | ORAL | Status: DC
Start: 2015-12-25 — End: 2016-09-25

## 2015-12-25 MED ORDER — HYDROCODONE-HOMATROPINE 5-1.5 MG/5ML PO SYRP: 5.0000 mL | ORAL_SOLUTION | Freq: Three times a day (TID) | ORAL | Status: DC | PRN

## 2015-12-25 MED ORDER — AMOXICILLIN-POT CLAVULANATE 875-125 MG PO TABS: 1.0000 | ORAL_TABLET | Freq: Two times a day (BID) | ORAL | Status: DC

## 2015-12-25 NOTE — Assessment & Plan Note (Signed)
In pt with hx of sinus surgery  Also wheezing/bronchitis symptoms  Drink lots of fluids and rest  Take augmentin for sinus infection  Try hycodan for cough- caution of sedation  Take prednisone for bronchitis /wheezing  Continue inhaler if needed

## 2015-12-25 NOTE — Progress Notes (Signed)
Pre visit review using our clinic review tool, if applicable. No additional management support is needed unless otherwise documented below in the visit note. 

## 2015-12-25 NOTE — Patient Instructions (Signed)
Drink lots of fluids and rest  Take augmentin for sinus infection  Try hycodan for cough- caution of sedation  Take prednisone for bronchitis /wheezing  Continue inhaler if needed   If BP does not come down further - let us know

## 2015-12-25 NOTE — Progress Notes (Signed)
Subjective:    Patient ID: Katrina Smith, female    DOB: 1951-09-09, 65 y.o.   MRN: [id number]  HPI Here with uri symptoms  Started early this month - caught from her daughter   Started as a head cold  Then very congested -cannot get anything out of her sinuses   Then cough and wheeze Just used inhaler- made her jittery- bp and pulse up   Tried plan mucinex nasonex Saline spray   Her sinus surgery was a while ago   Pain under both eyes- feel full and swollen  R ear is itchy and stopped up feeling   No fever     Patient Active Problem List   Diagnosis Date Noted  . Fecal occult blood test positive 09/28/2014  . Hyperkalemia 09/14/2014  . Sinusitis, acute 09/13/2014  . Lichen sclerosus of female genitalia 04/27/2014  . Colon cancer screening 02/17/2013  . Encounter for routine gynecological examination 02/17/2013  . Other screening mammogram 09/25/2012  . Routine general medical examination at a health care facility 09/01/2012  . Allergic rhinitis   . Toxemia in pregnancy   . Chronic sinusitis   . OBESITY 05/27/2008  . MOTION SICKNESS 07/08/2007  . Diabetes type 2, controlled ([place]) 07/07/2007  . HYPERCHOLESTEROLEMIA, PURE 07/07/2007  . Essential hypertension 07/07/2007  . ALLERGIC RHINITIS 07/07/2007  . ASTHMA 07/07/2007   Past Medical History  Diagnosis Date  . Allergic rhinitis   . Asthma     mild  . Diabetes mellitus type II   . HTN, white coat   . Toxemia in pregnancy     x 2  . Chronic sinusitis     had nasal surgery  . Allergy   . GERD (gastroesophageal reflux disease)   . Hyperlipidemia   . Complication of anesthesia     local anesthetics- per pt "wear off quickly"   Past Surgical History  Procedure Laterality Date  . Cesarean section      x2  . Cholecystectomy  10/2004    gallstones  . Nasal sinus surgery  02/2008   Social History  Substance Use Topics  . Smoking status: Never Smoker   . Smokeless tobacco: Never Used  . Alcohol Use:  No   Family History  Problem Relation Age of Onset  . Hypertension Father   . Diabetes Mother   . Dementia Mother   . Heart disease Mother   . Rheum arthritis Brother   . Cancer Brother     kidney  . Colon cancer Neg Hx   . Esophageal cancer Neg Hx   . Stomach cancer Neg Hx   . Rectal cancer Neg Hx    Allergies  Allergen Reactions  . Other     Poppy seeds  . Xopenex [Levalbuterol] Other (See Comments)    Throat closed up.   Current Outpatient Prescriptions on File Prior to Visit  Medication Sig Dispense Refill  . albuterol (PROAIR HFA) 108 (90 BASE) MCG/ACT inhaler Inhale 2 puffs into the lungs every 6 (six) hours as needed.      Marland Kitchen amLODipine-atorvastatin (CADUET) 5-10 MG per tablet Take 1 tablet by mouth daily. 90 tablet 3  . aspirin EC 81 MG tablet Take 81 mg by mouth daily.      . beclomethasone (QVAR) 40 MCG/ACT inhaler Inhale 2 puffs into the lungs as needed.    . cetirizine (ZYRTEC) 10 MG tablet Take 10 mg by mouth daily.     . clobetasol cream (TEMOVATE) 0.05 %  Apply 1 application topically 2 (two) times daily. Apply to outer vulvar area on both sides 30 g 1  . Famotidine (PEPCID PO) Take by mouth as needed.    . fluticasone (FLONASE) 50 MCG/ACT nasal spray 2 sprays by Nasal route daily.      Marland Kitchen GLIPIZIDE XL 5 MG 24 hr tablet TAKE 1 TABLET DAILY 90 tablet 1  . glucose blood (ACCU-CHEK COMPACT PLUS) test strip CHECK BLOOD SUGAR ONCE DAILY AND AS NEEDED FOR DM (DX. E11.9) 100 each 1  . glucose blood test strip 1 each by Other route as directed. Use as instructed     . losartan-hydrochlorothiazide (HYZAAR) 100-25 MG per tablet TAKE 1 TABLET DAILY 90 tablet 3  . metFORMIN (GLUCOPHAGE) 1000 MG tablet TAKE 1 TABLET TWICE A DAY WITH MEALS 180 tablet 1  . montelukast (SINGULAIR) 10 MG tablet TAKE 1 TABLET AT BEDTIME 90 tablet 1  . naphazoline-pheniramine (NAPHCON-A) 0.025-0.3 % ophthalmic solution Place 1 drop into both eyes 4 (four) times daily as needed.      . Omega-3 Fatty  Acids (FISH OIL PO) Take by mouth. BID     No current facility-administered medications on file prior to visit.    Review of Systems Review of Systems  Constitutional: Negative for , appetite change,  and unexpected weight change.  ENT pos for cong /rhinorrhea/st and facial pain  Eyes: Negative for pain and visual disturbance.  Respiratory: Negative for shortness of breath.  pos for cough and wheeze  Cardiovascular: Negative for cp or palpitations    Gastrointestinal: Negative for nausea, diarrhea and constipation.  Genitourinary: Negative for urgency and frequency.  Skin: Negative for pallor or rash   Neurological: Negative for weakness, light-headedness, numbness and headaches.  Hematological: Negative for adenopathy. Does not bruise/bleed easily.  Psychiatric/Behavioral: Negative for dysphoric mood. The patient is not nervous/anxious.         Objective:   Physical Exam  Constitutional: She appears well-developed and well-nourished. No distress.  obese and well appearing   HENT:  Head: Normocephalic and atraumatic.  Right Ear: External ear normal.  Left Ear: External ear normal.  Mouth/Throat: Oropharynx is clear and moist. No oropharyngeal exudate.  Nares are injected and congested  Bilateral maxillary sinus tenderness  Post nasal drip   Eyes: Conjunctivae and EOM are normal. Pupils are equal, round, and reactive to light. Right eye exhibits no discharge. Left eye exhibits no discharge.  Neck: Normal range of motion. Neck supple.  Cardiovascular: Normal rate and regular rhythm.   Pulmonary/Chest: Effort normal. No respiratory distress. She has wheezes. She has no rales.  Diffuse exp wheeze with fair air exch occ rhonchi No rales   Lymphadenopathy:    She has no cervical adenopathy.  Neurological: She is alert. No cranial nerve deficit.  Skin: Skin is warm and dry. No rash noted.  Psychiatric: She has a normal mood and affect.          Assessment & Plan:    Problem List Items Addressed This Visit      Respiratory   Sinusitis, acute - Primary    In pt with hx of sinus surgery  Also wheezing/bronchitis symptoms  Drink lots of fluids and rest  Take augmentin for sinus infection  Try hycodan for cough- caution of sedation  Take prednisone for bronchitis /wheezing  Continue inhaler if needed       Relevant Medications   predniSONE (DELTASONE) 10 MG tablet   amoxicillin-clavulanate (AUGMENTIN) 875-125 MG tablet  HYDROcodone-homatropine (HYCODAN) 5-1.5 MG/5ML syrup

## 2016-02-17 ENCOUNTER — Other Ambulatory Visit: Payer: Self-pay | Admitting: Family Medicine

## 2016-02-26 ENCOUNTER — Other Ambulatory Visit: Payer: Self-pay | Admitting: Family Medicine

## 2016-04-02 ENCOUNTER — Other Ambulatory Visit: Payer: Self-pay | Admitting: *Deleted

## 2016-04-02 MED ORDER — LOSARTAN POTASSIUM-HCTZ 100-25 MG PO TABS: 1.0000 | ORAL_TABLET | Freq: Every day | ORAL | Status: DC

## 2016-04-02 MED ORDER — AMLODIPINE-ATORVASTATIN 5-10 MG PO TABS: 1.0000 | ORAL_TABLET | Freq: Every day | ORAL | Status: DC

## 2016-04-03 ENCOUNTER — Other Ambulatory Visit: Payer: BC Managed Care – PPO

## 2016-04-05 ENCOUNTER — Ambulatory Visit: Payer: BC Managed Care – PPO | Admitting: Family Medicine

## 2016-04-09 LAB — HM DIABETES EYE EXAM

## 2016-04-11 ENCOUNTER — Other Ambulatory Visit (INDEPENDENT_AMBULATORY_CARE_PROVIDER_SITE_OTHER): Payer: BC Managed Care – PPO

## 2016-04-11 DIAGNOSIS — E119 Type 2 diabetes mellitus without complications: Secondary | ICD-10-CM

## 2016-04-11 DIAGNOSIS — I10 Essential (primary) hypertension: Secondary | ICD-10-CM | POA: Diagnosis not present

## 2016-04-11 DIAGNOSIS — E7800 Pure hypercholesterolemia, unspecified: Secondary | ICD-10-CM

## 2016-04-11 LAB — COMPREHENSIVE METABOLIC PANEL
ALBUMIN: 4.1 g/dL (ref 3.5–5.2)
ALK PHOS: 90 U/L (ref 39–117)
ALT: 28 U/L (ref 0–35)
AST: 14 U/L (ref 0–37)
BUN: 10 mg/dL (ref 6–23)
CALCIUM: 9.4 mg/dL (ref 8.4–10.5)
CHLORIDE: 94 meq/L — AB (ref 96–112)
CO2: 32 mEq/L (ref 19–32)
Creatinine, Ser: 0.49 mg/dL (ref 0.40–1.20)
GFR: 135.18 mL/min (ref >60.00)
Glucose, Bld: 99 mg/dL (ref 70–99)
POTASSIUM: 4.2 meq/L (ref 3.5–5.1)
SODIUM: 132 meq/L — AB (ref 135–145)
TOTAL PROTEIN: 7.1 g/dL (ref 6.0–8.3)
Total Bilirubin: 0.3 mg/dL (ref 0.2–1.2)

## 2016-04-11 LAB — HEMOGLOBIN A1C: HEMOGLOBIN A1C: 7 % — AB (ref 4.6–6.5)

## 2016-04-11 LAB — LIPID PANEL
CHOLESTEROL: 189 mg/dL (ref 0–200)
HDL: 43.8 mg/dL (ref >39.00)
LDL Cholesterol: 122 mg/dL — ABNORMAL HIGH (ref 0–99)
NonHDL: 145.12
Total CHOL/HDL Ratio: 4
Triglycerides: 115 mg/dL (ref 0.0–149.0)
VLDL: 23 mg/dL (ref 0.0–40.0)

## 2016-04-12 ENCOUNTER — Encounter: Payer: Self-pay | Admitting: Family Medicine

## 2016-04-12 ENCOUNTER — Ambulatory Visit (INDEPENDENT_AMBULATORY_CARE_PROVIDER_SITE_OTHER): Payer: BC Managed Care – PPO | Admitting: Family Medicine

## 2016-04-12 VITALS — BP 128/80 | HR 97 | Temp 98.5°F | Ht 64.75 in | Wt 177.5 lb

## 2016-04-12 DIAGNOSIS — E119 Type 2 diabetes mellitus without complications: Secondary | ICD-10-CM

## 2016-04-12 DIAGNOSIS — I10 Essential (primary) hypertension: Secondary | ICD-10-CM | POA: Diagnosis not present

## 2016-04-12 DIAGNOSIS — Z23 Encounter for immunization: Secondary | ICD-10-CM | POA: Diagnosis not present

## 2016-04-12 DIAGNOSIS — E669 Obesity, unspecified: Secondary | ICD-10-CM

## 2016-04-12 DIAGNOSIS — E7800 Pure hypercholesterolemia, unspecified: Secondary | ICD-10-CM

## 2016-04-12 NOTE — Patient Instructions (Addendum)
Hep A booster  Take care of yourself  Get back on track with diet for diabetes  Your physical is due after 9/29

## 2016-04-12 NOTE — Progress Notes (Signed)
Pre visit review using our clinic review tool, if applicable. No additional management support is needed unless otherwise documented below in the visit note. 

## 2016-04-12 NOTE — Progress Notes (Signed)
Subjective:    Patient ID: Katrina Smith, female    DOB: 03-18-1951, 65 y.o.   MRN: [id number]  HPI Here for f/u of chronic health problems  Has been feeling well  Taking care of herself  Spent a month in [place] - did very well   Wt is down 6 lb with bmi of 29  bp is stable today  No cp or palpitations or headaches or edema  No side effects to medicines  BP Readings from Last 3 Encounters:  04/12/16 142/86  12/25/15 150/80  09/22/15 140/80      Diabetes  (was on prednisone in Jan) Home sugar results -no real changes DM diet - different when she traveled - high carb diet in [place] for a mo and eggs from protein  Is back on track now  Exercise - a lot more exercise - rides bike around [facility]  Symptoms-none  A1C last  Lab Results  Component Value Date   HGBA1C 7.0* 04/11/2016  this is up from 6.9  No problems with medications -metformin and glipizide Renal protection- arb Last eye exam - just had one in January - no retinop   Cholesterol Lab Results  Component Value Date   CHOL 189 04/11/2016   CHOL 152 09/20/2015   CHOL 152 09/06/2014   Lab Results  Component Value Date   HDL 43.80 04/11/2016   HDL 39.80 09/20/2015   HDL 35.10* 09/06/2014   Lab Results  Component Value Date   LDLCALC 122* 04/11/2016   LDLCALC 89 09/20/2015   LDLCALC 94 09/06/2014   Lab Results  Component Value Date   TRIG 115.0 04/11/2016   TRIG 116.0 09/20/2015   TRIG 117.0 09/06/2014   Lab Results  Component Value Date   CHOLHDL 4 04/11/2016   CHOLHDL 4 09/20/2015   CHOLHDL 4 09/06/2014   Lab Results  Component Value Date   LDLDIRECT 166.9 09/28/2010   had to eat eggs for protein in [place] for a month   On caduet- was w/o it for 2 wk in [place]  Patient Active Problem List   Diagnosis Date Noted  . Fecal occult blood test positive 09/28/2014  . Hyperkalemia 09/14/2014  . Sinusitis, acute 09/13/2014  . Lichen sclerosus of female genitalia 04/27/2014  . Colon  cancer screening 02/17/2013  . Encounter for routine gynecological examination 02/17/2013  . Other screening mammogram 09/25/2012  . Routine general medical examination at a health care facility 09/01/2012  . Allergic rhinitis   . Toxemia in pregnancy   . Chronic sinusitis   . OBESITY 05/27/2008  . MOTION SICKNESS 07/08/2007  . Diabetes type 2, controlled ([place]) 07/07/2007  . HYPERCHOLESTEROLEMIA, PURE 07/07/2007  . Essential hypertension 07/07/2007  . ALLERGIC RHINITIS 07/07/2007  . ASTHMA 07/07/2007   Past Medical History  Diagnosis Date  . Allergic rhinitis   . Asthma     mild  . Diabetes mellitus type II   . HTN, white coat   . Toxemia in pregnancy     x 2  . Chronic sinusitis     had nasal surgery  . Allergy   . GERD (gastroesophageal reflux disease)   . Hyperlipidemia   . Complication of anesthesia     local anesthetics- per pt "wear off quickly"   Past Surgical History  Procedure Laterality Date  . Cesarean section      x2  . Cholecystectomy  10/2004    gallstones  . Nasal sinus surgery  02/2008  Social History  Substance Use Topics  . Smoking status: Never Smoker   . Smokeless tobacco: Never Used  . Alcohol Use: No   Family History  Problem Relation Age of Onset  . Hypertension Father   . Diabetes Mother   . Dementia Mother   . Heart disease Mother   . Rheum arthritis Brother   . Cancer Brother     kidney  . Colon cancer Neg Hx   . Esophageal cancer Neg Hx   . Stomach cancer Neg Hx   . Rectal cancer Neg Hx    Allergies  Allergen Reactions  . Other     Poppy seeds  . Xopenex [Levalbuterol] Other (See Comments)    Throat closed up.   Current Outpatient Prescriptions on File Prior to Visit  Medication Sig Dispense Refill  . albuterol (PROAIR HFA) 108 (90 BASE) MCG/ACT inhaler Inhale 2 puffs into the lungs every 6 (six) hours as needed.      Marland Kitchen amLODipine-atorvastatin (CADUET) 5-10 MG tablet Take 1 tablet by mouth daily. 90 tablet 1  .  amoxicillin-clavulanate (AUGMENTIN) 875-125 MG tablet Take 1 tablet by mouth 2 (two) times daily. 20 tablet 0  . aspirin EC 81 MG tablet Take 81 mg by mouth daily.      . beclomethasone (QVAR) 40 MCG/ACT inhaler Inhale 2 puffs into the lungs as needed.    . cetirizine (ZYRTEC) 10 MG tablet Take 10 mg by mouth daily.     . clobetasol cream (TEMOVATE) [id number] % Apply 1 application topically 2 (two) times daily. Apply to outer vulvar area on both sides 30 g 1  . Famotidine (PEPCID PO) Take by mouth as needed.    . fluticasone (FLONASE) 50 MCG/ACT nasal spray 2 sprays by Nasal route daily.      Marland Kitchen GLIPIZIDE XL 5 MG 24 hr tablet TAKE 1 TABLET DAILY 90 tablet 1  . glucose blood (ACCU-CHEK COMPACT PLUS) test strip CHECK BLOOD SUGAR ONCE DAILY AND AS NEEDED FOR DM (DX. E11.9) 100 each 1  . glucose blood test strip 1 each by Other route as directed. Use as instructed     . HYDROcodone-homatropine (HYCODAN) 5-1.5 MG/5ML syrup Take 5 mLs by mouth every 8 (eight) hours as needed for cough (if not working or driving, watch for sedation). 120 mL 0  . losartan-hydrochlorothiazide (HYZAAR) 100-25 MG tablet Take 1 tablet by mouth daily. 90 tablet 1  . metFORMIN (GLUCOPHAGE) 1000 MG tablet TAKE 1 TABLET TWICE A DAY WITH MEALS 180 tablet 1  . montelukast (SINGULAIR) 10 MG tablet TAKE 1 TABLET AT BEDTIME 90 tablet 1  . naphazoline-pheniramine (NAPHCON-A) 0.025-0.3 % ophthalmic solution Place 1 drop into both eyes 4 (four) times daily as needed.      . Omega-3 Fatty Acids (FISH OIL PO) Take by mouth. BID    . predniSONE (DELTASONE) 10 MG tablet Take 3 pills once daily by mouth for 3 days, then 2 pills once daily for 3 days, then 1 pill once daily for 3 days and then stop 18 tablet 0   No current facility-administered medications on file prior to visit.     Review of Systems Review of Systems  Constitutional: Negative for fever, appetite change, fatigue and unexpected weight change.  Eyes: Negative for pain and visual  disturbance.  Respiratory: Negative for cough and shortness of breath.   Cardiovascular: Negative for cp or palpitations    Gastrointestinal: Negative for nausea, diarrhea and constipation.  Genitourinary: Negative for urgency and  frequency.  Skin: Negative for pallor or rash   Neurological: Negative for weakness, light-headedness, numbness and headaches.  Hematological: Negative for adenopathy. Does not bruise/bleed easily.  Psychiatric/Behavioral: Negative for dysphoric mood. The patient is not nervous/anxious.         Objective:   Physical Exam  Constitutional: She appears well-developed and well-nourished. No distress.  obese and well appearing   HENT:  Head: Normocephalic and atraumatic.  Mouth/Throat: Oropharynx is clear and moist.  Eyes: Conjunctivae and EOM are normal. Pupils are equal, round, and reactive to light.  Neck: Normal range of motion. Neck supple. No JVD present. Carotid bruit is not present. No thyromegaly present.  Cardiovascular: Normal rate, regular rhythm, normal heart sounds and intact distal pulses.  Exam reveals no gallop.   Pulmonary/Chest: Effort normal and breath sounds normal. No respiratory distress. She has no wheezes. She has no rales.  No crackles  Abdominal: Soft. Bowel sounds are normal. She exhibits no distension, no abdominal bruit and no mass. There is no tenderness.  Musculoskeletal: She exhibits no edema.  Lymphadenopathy:    She has no cervical adenopathy.  Neurological: She is alert. She has normal reflexes.  Skin: Skin is warm and dry. No rash noted.  Psychiatric: She has a normal mood and affect.          Assessment & Plan:   Problem List Items Addressed This Visit      Cardiovascular and Mediastinum   Essential hypertension - Primary    bp in fair control at this time  BP Readings from Last 1 Encounters:  04/12/16 128/80   No changes needed Disc lifstyle change with low sodium diet and exercise  Labs reviewed          Endocrine   Diabetes type 2, controlled ([place])    Lab Results  Component Value Date   HGBA1C 7.0* 04/11/2016   This is up - due to changed diet when in [place] for a month Is back on track with diet and exercise Re check and f/u in early fall        Other   HYPERCHOLESTEROLEMIA, PURE    This is up due to changed habits/diet in [place]- had do eat primarily eggs  Disc goals for lipids and reasons to control them Rev labs with pt Rev low sat fat diet in detail Re check in fall      OBESITY    Discussed how this problem influences overall health and the risks it imposes  Reviewed plan for weight loss with lower calorie diet (via better food choices and also portion control or program like weight watchers) and exercise building up to or more than 30 minutes 5 days per week including some aerobic activity          Other Visit Diagnoses    Need for hepatitis A immunization        Relevant Orders    Hepatitis A vaccine adult IM (Completed)

## 2016-04-14 NOTE — Assessment & Plan Note (Signed)
This is up due to changed habits/diet in [place]- had do eat primarily eggs  Disc goals for lipids and reasons to control them Rev labs with pt Rev low sat fat diet in detail Re check in fall

## 2016-04-14 NOTE — Assessment & Plan Note (Signed)
Discussed how this problem influences overall health and the risks it imposes  Reviewed plan for weight loss with lower calorie diet (via better food choices and also portion control or program like weight watchers) and exercise building up to or more than 30 minutes 5 days per week including some aerobic activity    

## 2016-04-14 NOTE — Assessment & Plan Note (Signed)
bp in fair control at this time  BP Readings from Last 1 Encounters:  04/12/16 128/80   No changes needed Disc lifstyle change with low sodium diet and exercise  Labs reviewed

## 2016-04-14 NOTE — Assessment & Plan Note (Signed)
Lab Results  Component Value Date   HGBA1C 7.0* 04/11/2016   This is up - due to changed diet when in [place] for a month Is back on track with diet and exercise Re check and f/u in early fall

## 2016-08-07 ENCOUNTER — Other Ambulatory Visit: Payer: Self-pay | Admitting: Family Medicine

## 2016-08-07 MED ORDER — METFORMIN HCL 1000 MG PO TABS: 1000.0000 mg | ORAL_TABLET | Freq: Two times a day (BID) | ORAL | 0 refills | Status: DC

## 2016-09-11 ENCOUNTER — Encounter: Payer: Self-pay | Admitting: Family Medicine

## 2016-09-22 ENCOUNTER — Telehealth: Payer: Self-pay | Admitting: Family Medicine

## 2016-09-22 DIAGNOSIS — E119 Type 2 diabetes mellitus without complications: Secondary | ICD-10-CM

## 2016-09-22 DIAGNOSIS — Z0000 Encounter for general adult medical examination without abnormal findings: Secondary | ICD-10-CM

## 2016-09-22 NOTE — Telephone Encounter (Signed)
-----   Message from Ellamae Sia sent at 09/18/2016 11:07 AM EDT ----- Regarding: Lab orders for Tuesday, 10.10.17 Patient is scheduled for CPX labs, please order future labs, Thanks , Karna Christmas

## 2016-09-23 ENCOUNTER — Other Ambulatory Visit: Payer: BC Managed Care – PPO

## 2016-09-24 ENCOUNTER — Other Ambulatory Visit (INDEPENDENT_AMBULATORY_CARE_PROVIDER_SITE_OTHER): Payer: BC Managed Care – PPO

## 2016-09-24 DIAGNOSIS — Z0000 Encounter for general adult medical examination without abnormal findings: Secondary | ICD-10-CM

## 2016-09-24 DIAGNOSIS — E119 Type 2 diabetes mellitus without complications: Secondary | ICD-10-CM | POA: Diagnosis not present

## 2016-09-24 LAB — CBC WITH DIFFERENTIAL/PLATELET
Basophils Absolute: 0.1 10*3/uL (ref 0.0–0.1)
Basophils Relative: 0.6 % (ref 0.0–3.0)
EOS ABS: 0.3 10*3/uL (ref 0.0–0.7)
Eosinophils Relative: 3.4 % (ref 0.0–5.0)
HCT: 39 % (ref 36.0–46.0)
HEMOGLOBIN: 13 g/dL (ref 12.0–15.0)
LYMPHS PCT: 37.9 % (ref 12.0–46.0)
Lymphs Abs: 3.6 10*3/uL (ref 0.7–4.0)
MCHC: 33.5 g/dL (ref 30.0–36.0)
MCV: 87.3 fl (ref 78.0–100.0)
MONO ABS: 0.8 10*3/uL (ref 0.1–1.0)
MONOS PCT: 7.9 % (ref 3.0–12.0)
Neutro Abs: 4.8 10*3/uL (ref 1.4–7.7)
Neutrophils Relative %: 50.2 % (ref 43.0–77.0)
Platelets: 346 10*3/uL (ref 150.0–400.0)
RBC: 4.46 Mil/uL (ref 3.87–5.11)
RDW: 13.9 % (ref 11.5–15.5)
WBC: 9.5 10*3/uL (ref 4.0–10.5)

## 2016-09-24 LAB — COMPREHENSIVE METABOLIC PANEL
ALBUMIN: 4.3 g/dL (ref 3.5–5.2)
ALK PHOS: 74 U/L (ref 39–117)
ALT: 20 U/L (ref 0–35)
AST: 14 U/L (ref 0–37)
BILIRUBIN TOTAL: 0.4 mg/dL (ref 0.2–1.2)
BUN: 9 mg/dL (ref 6–23)
CO2: 30 mEq/L (ref 19–32)
CREATININE: 0.5 mg/dL (ref 0.40–1.20)
Calcium: 9.7 mg/dL (ref 8.4–10.5)
Chloride: 96 mEq/L (ref 96–112)
GFR: 131.87 mL/min (ref >60.00)
Glucose, Bld: 96 mg/dL (ref 70–99)
Potassium: 4.5 mEq/L (ref 3.5–5.1)
SODIUM: 134 meq/L — AB (ref 135–145)
TOTAL PROTEIN: 7.1 g/dL (ref 6.0–8.3)

## 2016-09-24 LAB — LIPID PANEL
Cholesterol: 153 mg/dL (ref 0–200)
HDL: 42.1 mg/dL (ref >39.00)
LDL Cholesterol: 93 mg/dL (ref 0–99)
NONHDL: 111.17
Total CHOL/HDL Ratio: 4
Triglycerides: 91 mg/dL (ref 0.0–149.0)
VLDL: 18.2 mg/dL (ref 0.0–40.0)

## 2016-09-24 LAB — HEMOGLOBIN A1C: HEMOGLOBIN A1C: 6.6 % — AB (ref 4.6–6.5)

## 2016-09-24 LAB — TSH: TSH: 2.44 u[IU]/mL (ref 0.35–4.50)

## 2016-09-25 ENCOUNTER — Encounter: Payer: Self-pay | Admitting: Family Medicine

## 2016-09-25 ENCOUNTER — Other Ambulatory Visit (HOSPITAL_COMMUNITY)
Admission: RE | Admit: 2016-09-25 | Discharge: 2016-09-25 | Disposition: A | Discharge status: Home / Self Care | Payer: BC Managed Care – PPO | Source: Ambulatory Visit | Attending: Family Medicine | Admitting: Family Medicine

## 2016-09-25 ENCOUNTER — Ambulatory Visit (INDEPENDENT_AMBULATORY_CARE_PROVIDER_SITE_OTHER): Payer: BC Managed Care – PPO | Admitting: Family Medicine

## 2016-09-25 VITALS — BP 128/75 | HR 106 | Temp 98.1°F | Ht 65.0 in | Wt 174.7 lb

## 2016-09-25 DIAGNOSIS — Z23 Encounter for immunization: Secondary | ICD-10-CM | POA: Diagnosis not present

## 2016-09-25 DIAGNOSIS — E6609 Other obesity due to excess calories: Secondary | ICD-10-CM

## 2016-09-25 DIAGNOSIS — N904 Leukoplakia of vulva: Secondary | ICD-10-CM

## 2016-09-25 DIAGNOSIS — Z6830 Body mass index (BMI) 30.0-30.9, adult: Secondary | ICD-10-CM

## 2016-09-25 DIAGNOSIS — E119 Type 2 diabetes mellitus without complications: Secondary | ICD-10-CM | POA: Diagnosis not present

## 2016-09-25 DIAGNOSIS — Z01419 Encounter for gynecological examination (general) (routine) without abnormal findings: Secondary | ICD-10-CM

## 2016-09-25 DIAGNOSIS — Z1151 Encounter for screening for human papillomavirus (HPV): Secondary | ICD-10-CM | POA: Insufficient documentation

## 2016-09-25 DIAGNOSIS — Z0000 Encounter for general adult medical examination without abnormal findings: Secondary | ICD-10-CM | POA: Diagnosis not present

## 2016-09-25 DIAGNOSIS — I10 Essential (primary) hypertension: Secondary | ICD-10-CM | POA: Diagnosis not present

## 2016-09-25 DIAGNOSIS — E7800 Pure hypercholesterolemia, unspecified: Secondary | ICD-10-CM

## 2016-09-25 DIAGNOSIS — E66811 Obesity, class 1: Secondary | ICD-10-CM

## 2016-09-25 MED ORDER — GLIPIZIDE ER 5 MG PO TB24: 5.0000 mg | ORAL_TABLET | Freq: Every day | ORAL | 3 refills | Status: DC

## 2016-09-25 MED ORDER — AMLODIPINE-ATORVASTATIN 5-10 MG PO TABS: 1.0000 | ORAL_TABLET | Freq: Every day | ORAL | 1 refills | Status: DC

## 2016-09-25 MED ORDER — AMLODIPINE-ATORVASTATIN 5-10 MG PO TABS: 1.0000 | ORAL_TABLET | Freq: Every day | ORAL | 3 refills | Status: DC

## 2016-09-25 MED ORDER — MONTELUKAST SODIUM 10 MG PO TABS: 10.0000 mg | ORAL_TABLET | Freq: Every day | ORAL | 3 refills | Status: DC

## 2016-09-25 MED ORDER — LOSARTAN POTASSIUM-HCTZ 100-25 MG PO TABS: 1.0000 | ORAL_TABLET | Freq: Every day | ORAL | 3 refills | Status: DC

## 2016-09-25 MED ORDER — METFORMIN HCL 1000 MG PO TABS: 1000.0000 mg | ORAL_TABLET | Freq: Two times a day (BID) | ORAL | 3 refills | Status: DC

## 2016-09-25 NOTE — Assessment & Plan Note (Signed)
Discussed how this problem influences overall health and the risks it imposes  Reviewed plan for weight loss with lower calorie diet (via better food choices and also portion control or program like weight watchers) and exercise building up to or more than 30 minutes 5 days per week including some aerobic activity   Commended on wt loss so far  

## 2016-09-25 NOTE — Patient Instructions (Addendum)
If you are interested in a shingles/zoster vaccine - call your insurance to check on coverage,( you should not get it within 1 month of other vaccines) , then call us for a prescription  for it to take to a pharmacy that gives the shot , or make a nurse visit to get it here depending on your coverage  You are due for an eye exam if it has been a year-do not forget to set that up   Don't forget to schedule your annual mammogram   You are due for a colonoscopy in 2/18 - you can call us or the GI office to schedule after the first of the year   Follow up in 6 mo

## 2016-09-25 NOTE — Assessment & Plan Note (Signed)
bp in fair control at this time  BP Readings from Last 1 Encounters:  09/25/16 128/75   No changes needed Disc lifstyle change with low sodium diet and exercise  Labs reviewed  Enc further weight loss

## 2016-09-25 NOTE — Assessment & Plan Note (Signed)
Stable on exam today  Uses temovate prn sparingly

## 2016-09-25 NOTE — Assessment & Plan Note (Signed)
Reviewed health habits including diet and exercise and skin cancer prevention Reviewed appropriate screening tests for age  Also reviewed health mt list, fam hx and immunization status , as well as social and family history   See HPI Pt will schedule her mammogram and eye exam  [facility]onoscopy due 2/18 -[facility]  Flu shot today  She will check on coverage for zoster vaccine

## 2016-09-25 NOTE — Assessment & Plan Note (Signed)
Lab Results  Component Value Date   HGBA1C 6.6 (H) 09/24/2016   Improved On glipizide and metformin Loosing wt  Renal protection -ARB Enc continued exercise  She will schedule her own eye exam F/u 6 mo with lab prior

## 2016-09-25 NOTE — Progress Notes (Signed)
Subjective:    Patient ID: Katrina Smith, female    DOB: October 28, 1951, 65 y.o.   MRN: [id number]  HPI Here for health maintenance exam and to review chronic medical problems    Has been doing well  Taking good care of herself   Wt Readings from Last 3 Encounters:  09/25/16 174 lb 11.2 oz (79.2 kg)  04/12/16 177 lb 8 oz (80.5 kg)  12/25/15 183 lb 8 oz (83.2 kg)  almost down 10 lb since Jan -walks at least 2 mi per day  Eating well / trying to eat less meat  Eats a lot of spinach  Protein - eggs / some yogurt / peanuts and peanuts /fish  bmi is 29.0  Zoster vaccine -interested and will see if it is covered   opth exam-about a year ago   Flu shot -today   Pap 3/14 normal  Has not had a hysterectomy  Hx of lichen sclerosis - rarely flares up and steroid cream helps General vaginal dryness also    Mammogram 1/16-negative -she will schedule her own mammogram at [facility] breast exam-no lumps   Colonoscopy 12/15-adenomas - 3 year follow up   Tetanus shot 6/09   bp is up a little on the first check today  No cp or palpitations or headaches or edema  No side effects to medicines  BP Readings from Last 3 Encounters:  09/25/16 140/80  04/12/16 128/80  12/25/15 (!) 150/80      DM2 Lab Results  Component Value Date   HGBA1C 6.6 (H) 09/24/2016  down from 7 -working on diet   Hx of hyperlipidemia Lab Results  Component Value Date   CHOL 153 09/24/2016   CHOL 189 04/11/2016   CHOL 152 09/20/2015   Lab Results  Component Value Date   HDL 42.10 09/24/2016   HDL 43.80 04/11/2016   HDL 39.80 09/20/2015   Lab Results  Component Value Date   LDLCALC 93 09/24/2016   LDLCALC 122 (H) 04/11/2016   LDLCALC 89 09/20/2015   Lab Results  Component Value Date   TRIG 91.0 09/24/2016   TRIG 115.0 04/11/2016   TRIG 116.0 09/20/2015   Lab Results  Component Value Date   CHOLHDL 4 09/24/2016   CHOLHDL 4 04/11/2016   CHOLHDL 4 09/20/2015   Lab Results  Component  Value Date   LDLDIRECT 166.9 09/28/2010    Improved!  Atorvastatin and diet  Eating fish   Lab on 09/24/2016  Component Date Value Ref Range Status  . WBC 09/24/2016 9.5  4.0 - 10.5 K/uL Final  . RBC 09/24/2016 4.46  3.87 - 5.11 Mil/uL Final  . Hemoglobin 09/24/2016 13.0  12.0 - 15.0 g/dL Final  . HCT 09/24/2016 39.0  36.0 - 46.0 % Final  . MCV 09/24/2016 87.3  78.0 - 100.0 fl Final  . MCHC 09/24/2016 33.5  30.0 - 36.0 g/dL Final  . RDW 09/24/2016 13.9  11.5 - 15.5 % Final  . Platelets 09/24/2016 346.0  150.0 - 400.0 K/uL Final  . Neutrophils Relative % 09/24/2016 50.2  43.0 - 77.0 % Final  . Lymphocytes Relative 09/24/2016 37.9  12.0 - 46.0 % Final  . Monocytes Relative 09/24/2016 7.9  3.0 - 12.0 % Final  . Eosinophils Relative 09/24/2016 3.4  0.0 - 5.0 % Final  . Basophils Relative 09/24/2016 0.6  0.0 - 3.0 % Final  . Neutro Abs 09/24/2016 4.8  1.4 - 7.7 K/uL Final  . Lymphs Abs 09/24/2016 3.6  0.7 - 4.0 K/uL Final  . Monocytes Absolute 09/24/2016 0.8  0.1 - 1.0 K/uL Final  . Eosinophils Absolute 09/24/2016 0.3  0.0 - 0.7 K/uL Final  . Basophils Absolute 09/24/2016 0.1  0.0 - 0.1 K/uL Final  . Sodium 09/24/2016 134* 135 - 145 mEq/L Final  . Potassium 09/24/2016 4.5  3.5 - 5.1 mEq/L Final  . Chloride 09/24/2016 96  96 - 112 mEq/L Final  . CO2 09/24/2016 30  19 - 32 mEq/L Final  . Glucose, Bld 09/24/2016 96  70 - 99 mg/dL Final  . BUN 09/24/2016 9  6 - 23 mg/dL Final  . Creatinine, Ser 09/24/2016 0.50  0.40 - 1.20 mg/dL Final  . Total Bilirubin 09/24/2016 0.4  0.2 - 1.2 mg/dL Final  . Alkaline Phosphatase 09/24/2016 74  39 - 117 U/L Final  . AST 09/24/2016 14  0 - 37 U/L Final  . ALT 09/24/2016 20  0 - 35 U/L Final  . Total Protein 09/24/2016 7.1  6.0 - 8.3 g/dL Final  . Albumin 09/24/2016 4.3  3.5 - 5.2 g/dL Final  . Calcium 09/24/2016 9.7  8.4 - 10.5 mg/dL Final  . GFR 09/24/2016 131.87  >60.00 mL/min Final  . Hgb A1c MFr Bld 09/24/2016 6.6* 4.6 - 6.5 % Final  .  Cholesterol 09/24/2016 153  0 - 200 mg/dL Final  . Triglycerides 09/24/2016 91.0  0.0 - 149.0 mg/dL Final  . HDL 09/24/2016 42.10  >39.00 mg/dL Final  . VLDL 09/24/2016 18.2  0.0 - 40.0 mg/dL Final  . LDL Cholesterol 09/24/2016 93  0 - 99 mg/dL Final  . Total CHOL/HDL Ratio 09/24/2016 4   Final  . NonHDL 09/24/2016 111.17   Final  . TSH 09/24/2016 2.44  0.35 - 4.50 uIU/mL Final      Patient Active Problem List   Diagnosis Date Noted  . Fecal occult blood test positive 09/28/2014  . Hyperkalemia 09/14/2014  . Lichen sclerosus of female genitalia 04/27/2014  . Colon cancer screening 02/17/2013  . Encounter for routine gynecological examination 02/17/2013  . Other screening mammogram 09/25/2012  . Routine general medical examination at a health care facility 09/01/2012  . Allergic rhinitis   . Toxemia in pregnancy   . Chronic sinusitis   . Obesity 05/27/2008  . MOTION SICKNESS 07/08/2007  . Diabetes type 2, controlled ([place]) 07/07/2007  . HYPERCHOLESTEROLEMIA, PURE 07/07/2007  . Essential hypertension 07/07/2007  . ALLERGIC RHINITIS 07/07/2007  . ASTHMA 07/07/2007   Past Medical History:  Diagnosis Date  . Allergic rhinitis   . Allergy   . Asthma    mild  . Chronic sinusitis    had nasal surgery  . Complication of anesthesia    local anesthetics- per pt "wear off quickly"  . Diabetes mellitus type II   . GERD (gastroesophageal reflux disease)   . HTN, white coat   . Hyperlipidemia   . Toxemia in pregnancy    x 2   Past Surgical History:  Procedure Laterality Date  . CESAREAN SECTION     x2  . CHOLECYSTECTOMY  10/2004   gallstones  . NASAL SINUS SURGERY  02/2008   Social History  Substance Use Topics  . Smoking status: Never Smoker  . Smokeless tobacco: Never Used  . Alcohol use No   Family History  Problem Relation Age of Onset  . Hypertension Father   . Diabetes Mother   . Dementia Mother   . Heart disease Mother   . Rheum arthritis Brother   .  Cancer  Brother     kidney  . Colon cancer Neg Hx   . Esophageal cancer Neg Hx   . Stomach cancer Neg Hx   . Rectal cancer Neg Hx    Allergies  Allergen Reactions  . Other     Poppy seeds  . Xopenex [Levalbuterol] Other (See Comments)    Throat closed up.   Current Outpatient Prescriptions on File Prior to Visit  Medication Sig Dispense Refill  . albuterol (PROAIR HFA) 108 (90 BASE) MCG/ACT inhaler Inhale 2 puffs into the lungs every 6 (six) hours as needed.      Marland Kitchen aspirin EC 81 MG tablet Take 81 mg by mouth daily.      . beclomethasone (QVAR) 40 MCG/ACT inhaler Inhale 2 puffs into the lungs as needed.    . cetirizine (ZYRTEC) 10 MG tablet Take 10 mg by mouth daily.     . clobetasol cream (TEMOVATE) [id number] % Apply 1 application topically 2 (two) times daily. Apply to outer vulvar area on both sides 30 g 1  . Famotidine (PEPCID PO) Take by mouth as needed.    . fluticasone (FLONASE) 50 MCG/ACT nasal spray 2 sprays by Nasal route daily.      Marland Kitchen glucose blood (ACCU-CHEK COMPACT PLUS) test strip CHECK BLOOD SUGAR ONCE DAILY AND AS NEEDED FOR DM (DX. E11.9) 100 each 1  . glucose blood test strip 1 each by Other route as directed. Use as instructed     . naphazoline-pheniramine (NAPHCON-A) 0.025-0.3 % ophthalmic solution Place 1 drop into both eyes 4 (four) times daily as needed.      . Omega-3 Fatty Acids (FISH OIL PO) Take by mouth. BID     No current facility-administered medications on file prior to visit.     Review of Systems Review of Systems  Constitutional: Negative for fever, appetite change, fatigue and unexpected weight change.  Eyes: Negative for pain and visual disturbance.  Respiratory: Negative for cough and shortness of breath.   Cardiovascular: Negative for cp or palpitations    Gastrointestinal: Negative for nausea, diarrhea and constipation.  Genitourinary: Negative for urgency and frequency.  Skin: Negative for pallor or rash   Neurological: Negative for weakness,  light-headedness, numbness and headaches.  Hematological: Negative for adenopathy. Does not bruise/bleed easily.  Psychiatric/Behavioral: Negative for dysphoric mood. The patient is not nervous/anxious.         Objective:   Physical Exam  Constitutional: She appears well-developed and well-nourished. No distress.  obese and well appearing   HENT:  Head: Normocephalic and atraumatic.  Right Ear: External ear normal.  Left Ear: External ear normal.  Mouth/Throat: Oropharynx is clear and moist.  Eyes: Conjunctivae and EOM are normal. Pupils are equal, round, and reactive to light. No scleral icterus.  Neck: Normal range of motion. Neck supple. No JVD present. Carotid bruit is not present. No thyromegaly present.  Cardiovascular: Normal rate, regular rhythm, normal heart sounds and intact distal pulses.  Exam reveals no gallop.   Pulmonary/Chest: Effort normal and breath sounds normal. No respiratory distress. She has no wheezes. She exhibits no tenderness.  Abdominal: Soft. Bowel sounds are normal. She exhibits no distension, no abdominal bruit and no mass. There is no tenderness.  Genitourinary: No breast swelling, tenderness, discharge or bleeding.  Genitourinary Comments: Breast exam: No mass, nodules, thickening, tenderness, bulging, retraction, inflamation, nipple discharge or skin changes noted.  No axillary or clavicular LA.  Anus appears normal w/o hemorrhoids or masses     External genitalia : nl appearance and hair distribution/no lesions (lichen sclerosis is not active)    Urethral meatus : nl size, no lesions or prolapse     Urethra: no masses, tenderness or scarring small vaginal introitus with atrophic mucosa noted   Bladder : no masses or tenderness     Vagina: nl general appearance, no discharge or  Lesions, no significant cystocele  or rectocele     Cervix: not well visualized due to small introitus /pt tolerance of speculum    Uterus: nl  size, contour, position, and mobility (not fixed) , non tender    Adnexa : no masses, tenderness, enlargement or nodularity    Pap obt from vagina-not cervical os      Musculoskeletal: Normal range of motion. She exhibits no edema or tenderness.  Lymphadenopathy:    She has no cervical adenopathy.  Neurological: She is alert. She has normal reflexes. No cranial nerve deficit. She exhibits normal muscle tone. Coordination normal.  Skin: Skin is warm and dry. No rash noted. No erythema. No pallor.  Mildly tanned with some solar lentigines  Athlete's foot with some scale on R foot , great toenail is thickened  L foot- recent injury and loose toenail that is non tender  Psychiatric: She has a normal mood and affect.          Assessment & Plan:   Problem List Items Addressed This Visit      Cardiovascular and Mediastinum   Essential hypertension    bp in fair control at this time  BP Readings from Last 1 Encounters:  09/25/16 128/75   No changes needed Disc lifstyle change with low sodium diet and exercise  Labs reviewed  Enc further weight loss       Relevant Medications   losartan-hydrochlorothiazide (HYZAAR) 100-25 MG tablet   amLODipine-atorvastatin (CADUET) 5-10 MG tablet     Endocrine   Diabetes type 2, controlled ([place])    Lab Results  Component Value Date   HGBA1C 6.6 (H) 09/24/2016   Improved On glipizide and metformin Loosing wt  Renal protection -ARB Enc continued exercise  She will schedule her own eye exam F/u 6 mo with lab prior       Relevant Medications   glipiZIDE (GLIPIZIDE XL) 5 MG 24 hr tablet   losartan-hydrochlorothiazide (HYZAAR) 100-25 MG tablet   metFORMIN (GLUCOPHAGE) 1000 MG tablet     Musculoskeletal and Integument   Lichen sclerosus of female genitalia    Stable on exam today  Uses temovate prn sparingly        Other   Encounter for routine gynecological examination    Exam done and pap attempted (small introitus and  vaginal atrophy - did not see cervix well or get endocervical sample) Lichen sclerosis is well controlled  No c/o      Relevant Orders   Cytology - PAP   HYPERCHOLESTEROLEMIA, PURE    Improved a bit with better diet and exercise  Disc goals for lipids and reasons to control them Rev labs with pt Rev low sat fat diet in detail       Relevant Medications   losartan-hydrochlorothiazide (HYZAAR) 100-25 MG tablet   amLODipine-atorvastatin (CADUET) 5-10 MG tablet   Obesity    Discussed how this problem influences overall health and the risks it imposes  Reviewed plan for weight loss with lower calorie diet (via better food choices and also portion control or  program like weight watchers) and exercise building up to or more than 30 minutes 5 days per week including some aerobic activity   Commended on wt loss so far       Relevant Medications   glipiZIDE (GLIPIZIDE XL) 5 MG 24 hr tablet   metFORMIN (GLUCOPHAGE) 1000 MG tablet   Routine general medical examination at a health care facility    Reviewed health habits including diet and exercise and skin cancer prevention Reviewed appropriate screening tests for age  Also reviewed health mt list, fam hx and immunization status , as well as social and family history   See HPI Pt will schedule her mammogram and eye exam  [facility] 2/18 [facility]  Flu shot today  She will check on coverage for zoster vaccine        Other Visit Diagnoses   None.

## 2016-09-25 NOTE — Assessment & Plan Note (Signed)
Exam done and pap attempted (small introitus and vaginal atrophy - did not see cervix well or get endocervical sample) Lichen sclerosis is well controlled  No c/o

## 2016-09-25 NOTE — Assessment & Plan Note (Signed)
Improved a bit with better diet and exercise  Disc goals for lipids and reasons to control them Rev labs with pt Rev low sat fat diet in detail

## 2016-09-25 NOTE — Progress Notes (Signed)
Pre visit review using our clinic review tool, if applicable. No additional management support is needed unless otherwise documented below in the visit note. 

## 2016-09-26 ENCOUNTER — Other Ambulatory Visit: Payer: BC Managed Care – PPO

## 2016-09-26 LAB — CYTOLOGY - PAP

## 2016-09-27 ENCOUNTER — Encounter: Payer: BC Managed Care – PPO | Admitting: Family Medicine

## 2016-10-09 ENCOUNTER — Telehealth: Payer: Self-pay | Admitting: Family Medicine

## 2016-10-09 ENCOUNTER — Encounter: Payer: Self-pay | Admitting: Family Medicine

## 2016-10-09 MED ORDER — ATORVASTATIN CALCIUM 10 MG PO TABS: 10.0000 mg | ORAL_TABLET | Freq: Every day | ORAL | 3 refills | Status: DC

## 2016-10-09 MED ORDER — AMLODIPINE BESYLATE 5 MG PO TABS: 5.0000 mg | ORAL_TABLET | Freq: Every day | ORAL | 3 refills | Status: DC

## 2016-10-09 NOTE — Telephone Encounter (Signed)
Med refill -see mychart note

## 2016-10-26 ENCOUNTER — Other Ambulatory Visit: Payer: Self-pay | Admitting: Family Medicine

## 2017-03-28 ENCOUNTER — Ambulatory Visit: Payer: BC Managed Care – PPO | Admitting: Family Medicine

## 2017-04-09 ENCOUNTER — Encounter: Payer: Self-pay | Admitting: Family Medicine

## 2017-04-09 ENCOUNTER — Ambulatory Visit (INDEPENDENT_AMBULATORY_CARE_PROVIDER_SITE_OTHER): Payer: BC Managed Care – PPO | Admitting: Family Medicine

## 2017-04-09 VITALS — BP 128/70 | HR 96 | Temp 97.6°F | Ht 65.0 in | Wt 175.5 lb

## 2017-04-09 DIAGNOSIS — I10 Essential (primary) hypertension: Secondary | ICD-10-CM

## 2017-04-09 DIAGNOSIS — E6609 Other obesity due to excess calories: Secondary | ICD-10-CM | POA: Diagnosis not present

## 2017-04-09 DIAGNOSIS — E119 Type 2 diabetes mellitus without complications: Secondary | ICD-10-CM | POA: Diagnosis not present

## 2017-04-09 DIAGNOSIS — E7800 Pure hypercholesterolemia, unspecified: Secondary | ICD-10-CM

## 2017-04-09 DIAGNOSIS — Z6830 Body mass index (BMI) 30.0-30.9, adult: Secondary | ICD-10-CM

## 2017-04-09 LAB — COMPREHENSIVE METABOLIC PANEL
ALT: 20 U/L (ref 0–35)
AST: 17 U/L (ref 0–37)
Albumin: 4.6 g/dL (ref 3.5–5.2)
Alkaline Phosphatase: 76 U/L (ref 39–117)
BUN: 8 mg/dL (ref 6–23)
CHLORIDE: 91 meq/L — AB (ref 96–112)
CO2: 31 meq/L (ref 19–32)
Calcium: 10 mg/dL (ref 8.4–10.5)
Creatinine, Ser: 0.51 mg/dL (ref 0.40–1.20)
GFR: 128.67 mL/min (ref >60.00)
GLUCOSE: 112 mg/dL — AB (ref 70–99)
POTASSIUM: 4.4 meq/L (ref 3.5–5.1)
SODIUM: 128 meq/L — AB (ref 135–145)
Total Bilirubin: 0.6 mg/dL (ref 0.2–1.2)
Total Protein: 7.4 g/dL (ref 6.0–8.3)

## 2017-04-09 LAB — LIPID PANEL
Cholesterol: 248 mg/dL — ABNORMAL HIGH (ref 0–200)
HDL: 47.4 mg/dL (ref >39.00)
LDL CALC: 180 mg/dL — AB (ref 0–99)
NONHDL: 200.84
Total CHOL/HDL Ratio: 5
Triglycerides: 106 mg/dL (ref 0.0–149.0)
VLDL: 21.2 mg/dL (ref 0.0–40.0)

## 2017-04-09 LAB — HEMOGLOBIN A1C: HEMOGLOBIN A1C: 6.4 % (ref 4.6–6.5)

## 2017-04-09 NOTE — Progress Notes (Signed)
Pre visit review using our clinic review tool, if applicable. No additional management support is needed unless otherwise documented below in the visit note. 

## 2017-04-09 NOTE — Progress Notes (Signed)
Subjective:    Patient ID: Katrina Smith, female    DOB: 1951/12/05, 66 y.o.   MRN: [id number]  HPI Here for f/u of chronic health problems   Feeling good and doing well  Lives in [place] visiting for the week  Daughter's 30th birthday    Wt Readings from Last 3 Encounters:  04/09/17 175 lb 8 oz (79.6 kg)  09/25/16 174 lb 11.2 oz (79.2 kg)  04/12/16 177 lb 8 oz (80.5 kg)  doing great with self care /eating and exercise  Walks a lot for exercise every day  bmi 29.2 On wt watchers program    bp is stable today  No cp or palpitations or headaches or edema  No side effects to medicines  BP Readings from Last 3 Encounters:  04/09/17 128/70  09/25/16 128/75  04/12/16 128/80      Diabetes Home sugar results  DM diet - doing well  Exercise -good  Symptoms A1C last  Lab Results  Component Value Date   HGBA1C 6.6 (H) 09/24/2016  due for A1C today  Lab Results  Component Value Date   MICROALBUR 0.2 09/02/2008   Now on ARB-no longer needs microalb No problems with medications  Renal protection- ARB Last eye exam  - almost a year ago   Hx of hyperlipidemia Lab Results  Component Value Date   CHOL 153 09/24/2016   CHOL 189 04/11/2016   CHOL 152 09/20/2015   Lab Results  Component Value Date   HDL 42.10 09/24/2016   HDL 43.80 04/11/2016   HDL 39.80 09/20/2015   Lab Results  Component Value Date   LDLCALC 93 09/24/2016   LDLCALC 122 (H) 04/11/2016   LDLCALC 89 09/20/2015   Lab Results  Component Value Date   TRIG 91.0 09/24/2016   TRIG 115.0 04/11/2016   TRIG 116.0 09/20/2015   Lab Results  Component Value Date   CHOLHDL 4 09/24/2016   CHOLHDL 4 04/11/2016   CHOLHDL 4 09/20/2015   Lab Results  Component Value Date   LDLDIRECT 166.9 09/28/2010   atorvastatin and diet   Patient Active Problem List   Diagnosis Date Noted  . Fecal occult blood test positive 09/28/2014  . Hyperkalemia 09/14/2014  . 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 Colon cancer screening 02/17/2013  . Encounter for routine gynecological examination 02/17/2013  . Other screening mammogram 09/25/2012  . Routine general medical examination at a health care facility 09/01/2012  . Allergic rhinitis   . Toxemia in pregnancy   . Chronic sinusitis   . Obesity 05/27/2008  . Diabetes type 2, controlled ([place]) 07/07/2007  . HYPERCHOLESTEROLEMIA, PURE 07/07/2007  . Essential hypertension 07/07/2007  . ALLERGIC RHINITIS 07/07/2007  . ASTHMA 07/07/2007   Past Medical History:  Diagnosis Date  . Allergic rhinitis   . Allergy   . Asthma    mild  . Chronic sinusitis    had nasal surgery  . Complication of anesthesia    local anesthetics- per pt "wear off quickly"  . Diabetes mellitus type II   . GERD (gastroesophageal reflux disease)   . HTN, white coat   . Hyperlipidemia   . Toxemia in pregnancy    x 2   Past Surgical History:  Procedure Laterality Date  . CESAREAN SECTION     x2  . CHOLECYSTECTOMY  10/2004   gallstones  . NASAL SINUS SURGERY  02/2008   Social History  Substance Use Topics  . Smoking status: Never  Smoker  . Smokeless tobacco: Never Used  . Alcohol use No   Family History  Problem Relation Age of Onset  . Hypertension Father   . Diabetes Mother   . Dementia Mother   . Heart disease Mother   . Rheum arthritis Brother   . Cancer Brother     kidney  . Colon cancer Neg Hx   . Esophageal cancer Neg Hx   . Stomach cancer Neg Hx   . Rectal cancer Neg Hx    Allergies  Allergen Reactions  . Other     Poppy seeds  . Xopenex [Levalbuterol] Other (See Comments)    Throat closed up.   Current Outpatient Prescriptions on File Prior to Visit  Medication Sig Dispense Refill  . albuterol (PROAIR HFA) 108 (90 BASE) MCG/ACT inhaler Inhale 2 puffs into the lungs every 6 (six) hours as needed.      Marland Kitchen amLODipine (NORVASC) 5 MG tablet Take 1 tablet (5 mg total) by mouth daily. 90 tablet 3  . aspirin EC 81 MG tablet Take 81 mg by  mouth daily.      Marland Kitchen atorvastatin (LIPITOR) 10 MG tablet Take 1 tablet (10 mg total) by mouth daily. 90 tablet 3  . beclomethasone (QVAR) 40 MCG/ACT inhaler Inhale 2 puffs into the lungs as needed.    . cetirizine (ZYRTEC) 10 MG tablet Take 10 mg by mouth daily.     . clobetasol cream (TEMOVATE) 8.41 % Apply 1 application topically 2 (two) times daily. Apply to outer vulvar area on both sides 30 g 1  . Famotidine (PEPCID PO) Take by mouth as needed.    . fluticasone (FLONASE) 50 MCG/ACT nasal spray 2 sprays by Nasal route daily.      Marland Kitchen glipiZIDE (GLIPIZIDE XL) 5 MG 24 hr tablet Take 1 tablet (5 mg total) by mouth daily. 90 tablet 3  . glucose blood (ACCU-CHEK COMPACT PLUS) test strip CHECK BLOOD SUGAR ONCE DAILY AND AS NEEDED FOR DM (DX. E11.9) 100 each 1  . glucose blood test strip 1 each by Other route as directed. Use as instructed     . metFORMIN (GLUCOPHAGE) 1000 MG tablet Take 1 tablet (1,000 mg total) by mouth 2 (two) times daily with a meal. 180 tablet 3  . montelukast (SINGULAIR) 10 MG tablet Take 1 tablet (10 mg total) by mouth at bedtime. 90 tablet 3  . naphazoline-pheniramine (NAPHCON-A) 0.025-0.3 % ophthalmic solution Place 1 drop into both eyes 4 (four) times daily as needed.      . Omega-3 Fatty Acids (FISH OIL PO) Take by mouth. BID     No current facility-administered medications on file prior to visit.     Review of Systems Review of Systems  Constitutional: Negative for fever, appetite change, fatigue and unexpected weight change.  Eyes: Negative for pain and visual disturbance.  Respiratory: Negative for cough and shortness of breath.   Cardiovascular: Negative for cp or palpitations    Gastrointestinal: Negative for nausea, diarrhea and constipation.  Genitourinary: Negative for urgency and frequency.  Skin: Negative for pallor or rash   Neurological: Negative for weakness, light-headedness, numbness and headaches.  Hematological: Negative for adenopathy. Does not  bruise/bleed easily.  Psychiatric/Behavioral: Negative for dysphoric mood. The patient is not nervous/anxious.         Objective:   Physical Exam  Constitutional: She appears well-developed and well-nourished. No distress.  obese and well appearing   HENT:  Head: Normocephalic and atraumatic.  Right Ear: External  ear normal.  Left Ear: External ear normal.  Nose: Nose normal.  Mouth/Throat: Oropharynx is clear and moist.  Eyes: Conjunctivae and EOM are normal. Pupils are equal, round, and reactive to light. Right eye exhibits no discharge. Left eye exhibits no discharge. No scleral icterus.  Neck: Normal range of motion. Neck supple. No JVD present. Carotid bruit is not present. No thyromegaly present.  Cardiovascular: Normal rate, regular rhythm, normal heart sounds and intact distal pulses.  Exam reveals no gallop.   Pulmonary/Chest: Effort normal and breath sounds normal. No respiratory distress. She has no wheezes. She has no rales.  Abdominal: Soft. Bowel sounds are normal. She exhibits no distension and no mass. There is no tenderness.  Musculoskeletal: She exhibits no edema or tenderness.  Lymphadenopathy:    She has no cervical adenopathy.  Neurological: She is alert. She has normal reflexes. No cranial nerve deficit. She exhibits normal muscle tone. Coordination normal.  Skin: Skin is warm and dry. No rash noted. No erythema. No pallor.  Ruddy complexion   Psychiatric: She has a normal mood and affect.          Assessment & Plan:   Problem List Items Addressed This Visit      Cardiovascular and Mediastinum   Essential hypertension - Primary    bp in fair control at this time  BP Readings from Last 1 Encounters:  04/09/17 128/70   No changes needed Disc lifstyle change with low sodium diet and exercise  Labs today        Relevant Orders   Comprehensive metabolic panel (Completed)     Endocrine   Diabetes type 2, controlled ([place])    Due for A1c Has been  well controlled Wt is stable Excellent health habits She will schedule annual eye exam Disc foot care       Relevant Orders   Lipid panel (Completed)   Hemoglobin A1c (Completed)     Other   HYPERCHOLESTEROLEMIA, PURE    Disc goals for lipids and reasons to control them Rev labs with pt  (last check) Rev low sat fat diet in detail  Labs today on atorvastatin and diet       Relevant Orders   Lipid panel (Completed)   Obesity    Wt is stable with excellent health habits Discussed how this problem influences overall health and the risks it imposes  Reviewed plan for weight loss with lower calorie diet (via better food choices and also portion control or program like weight watchers) and exercise building up to or more than 30 minutes 5 days per week including some aerobic activity

## 2017-04-09 NOTE — Patient Instructions (Addendum)
Don't forget to get your yearly eye exam  Also mammogram  Take care of yourself  Labs today   Schedule your welcome to medicare visit for the fall

## 2017-04-10 ENCOUNTER — Encounter: Payer: Self-pay | Admitting: Family Medicine

## 2017-04-10 ENCOUNTER — Telehealth: Payer: Self-pay | Admitting: *Deleted

## 2017-04-10 MED ORDER — LOSARTAN POTASSIUM 100 MG PO TABS: 100.0000 mg | ORAL_TABLET | Freq: Every day | ORAL | 3 refills | Status: DC

## 2017-04-10 NOTE — Assessment & Plan Note (Signed)
Wt is stable with excellent health habits Discussed how this problem influences overall health and the risks it imposes  Reviewed plan for weight loss with lower calorie diet (via better food choices and also portion control or program like weight watchers) and exercise building up to or more than 30 minutes 5 days per week including some aerobic activity

## 2017-04-10 NOTE — Telephone Encounter (Signed)
Pt responded to Dr. Glori Bickers via Deloris Ping and Rx sent to pharmacy

## 2017-04-10 NOTE — Assessment & Plan Note (Signed)
bp in fair control at this time  BP Readings from Last 1 Encounters:  04/09/17 128/70   No changes needed Disc lifstyle change with low sodium diet and exercise  Labs today

## 2017-04-10 NOTE — Assessment & Plan Note (Signed)
Disc goals for lipids and reasons to control them Rev labs with pt  (last check) Rev low sat fat diet in detail  Labs today on atorvastatin and diet

## 2017-04-10 NOTE — Assessment & Plan Note (Signed)
Due for A1c Has been well controlled Wt is stable Excellent health habits She will schedule annual eye exam Disc foot care

## 2017-04-10 NOTE — Telephone Encounter (Signed)
-----   Message from Abner Greenspan, MD sent at 04/10/2017 11:59 AM EDT ----- Her losartan hct needs to be changed to plain losartan since sodium is so low  Losartan 100 mg 1 po #90 3 ref to her pharmacy please  D/c the losartan hct  She will need to find a place [place] that we can send an order to check NA level in 2 wk  Also ask if she is taking her cholesterol med  These were released on mychart

## 2017-04-16 ENCOUNTER — Encounter: Payer: Self-pay | Admitting: Family Medicine

## 2017-07-11 ENCOUNTER — Encounter: Payer: Self-pay | Admitting: Family Medicine

## 2017-07-11 ENCOUNTER — Other Ambulatory Visit: Payer: Self-pay | Admitting: *Deleted

## 2017-07-11 MED ORDER — GLIPIZIDE ER 5 MG PO TB24: 5.0000 mg | ORAL_TABLET | Freq: Every day | ORAL | 0 refills | Status: DC

## 2017-07-11 MED ORDER — AMLODIPINE BESYLATE 5 MG PO TABS: 5.0000 mg | ORAL_TABLET | Freq: Every day | ORAL | 0 refills | Status: DC

## 2017-07-11 MED ORDER — LOSARTAN POTASSIUM 100 MG PO TABS: 100.0000 mg | ORAL_TABLET | Freq: Every day | ORAL | 0 refills | Status: DC

## 2017-07-16 ENCOUNTER — Encounter: Payer: Self-pay | Admitting: Family Medicine

## 2017-07-16 MED ORDER — ATORVASTATIN CALCIUM 10 MG PO TABS: 10.0000 mg | ORAL_TABLET | Freq: Every day | ORAL | 1 refills | Status: DC

## 2017-07-16 MED ORDER — LOSARTAN POTASSIUM 100 MG PO TABS: 100.0000 mg | ORAL_TABLET | Freq: Every day | ORAL | 0 refills | Status: DC

## 2017-07-16 MED ORDER — MONTELUKAST SODIUM 10 MG PO TABS: 10.0000 mg | ORAL_TABLET | Freq: Every day | ORAL | 1 refills | Status: DC

## 2017-07-16 MED ORDER — METFORMIN HCL 1000 MG PO TABS: 1000.0000 mg | ORAL_TABLET | Freq: Two times a day (BID) | ORAL | 1 refills | Status: DC

## 2017-08-29 ENCOUNTER — Other Ambulatory Visit: Payer: Self-pay | Admitting: Family Medicine

## 2017-09-04 ENCOUNTER — Other Ambulatory Visit: Payer: Self-pay | Admitting: Family Medicine

## 2017-09-14 ENCOUNTER — Telehealth: Payer: Self-pay | Admitting: Family Medicine

## 2017-09-14 DIAGNOSIS — E7800 Pure hypercholesterolemia, unspecified: Secondary | ICD-10-CM

## 2017-09-14 DIAGNOSIS — E119 Type 2 diabetes mellitus without complications: Secondary | ICD-10-CM

## 2017-09-14 DIAGNOSIS — I10 Essential (primary) hypertension: Secondary | ICD-10-CM

## 2017-09-14 DIAGNOSIS — Z0000 Encounter for general adult medical examination without abnormal findings: Secondary | ICD-10-CM

## 2017-09-14 NOTE — Telephone Encounter (Signed)
-----   Message from Ellamae Sia sent at 09/10/2017  3:16 PM EDT ----- Regarding: lab orders for Thursday, 10.4.18 Patient is scheduled for CPX labs, please order future labs, Thanks , Karna Christmas

## 2017-09-18 ENCOUNTER — Other Ambulatory Visit (INDEPENDENT_AMBULATORY_CARE_PROVIDER_SITE_OTHER): Payer: Medicare Other

## 2017-09-18 DIAGNOSIS — E119 Type 2 diabetes mellitus without complications: Secondary | ICD-10-CM | POA: Diagnosis not present

## 2017-09-18 DIAGNOSIS — I10 Essential (primary) hypertension: Secondary | ICD-10-CM

## 2017-09-18 DIAGNOSIS — E7800 Pure hypercholesterolemia, unspecified: Secondary | ICD-10-CM | POA: Diagnosis not present

## 2017-09-18 LAB — CBC WITH DIFFERENTIAL/PLATELET
BASOS ABS: 0.1 10*3/uL (ref 0.0–0.1)
Basophils Relative: 0.8 % (ref 0.0–3.0)
EOS ABS: 0.3 10*3/uL (ref 0.0–0.7)
Eosinophils Relative: 2.8 % (ref 0.0–5.0)
HCT: 40 % (ref 36.0–46.0)
Hemoglobin: 12.9 g/dL (ref 12.0–15.0)
LYMPHS ABS: 3.5 10*3/uL (ref 0.7–4.0)
Lymphocytes Relative: 35.1 % (ref 12.0–46.0)
MCHC: 32.1 g/dL (ref 30.0–36.0)
MCV: 92.7 fl (ref 78.0–100.0)
MONO ABS: 0.7 10*3/uL (ref 0.1–1.0)
Monocytes Relative: 7.2 % (ref 3.0–12.0)
NEUTROS ABS: 5.4 10*3/uL (ref 1.4–7.7)
NEUTROS PCT: 54.1 % (ref 43.0–77.0)
PLATELETS: 372 10*3/uL (ref 150.0–400.0)
RBC: 4.32 Mil/uL (ref 3.87–5.11)
RDW: 13.2 % (ref 11.5–15.5)
WBC: 10 10*3/uL (ref 4.0–10.5)

## 2017-09-18 LAB — LIPID PANEL
CHOL/HDL RATIO: 4
CHOLESTEROL: 172 mg/dL (ref 0–200)
HDL: 42.1 mg/dL (ref >39.00)
LDL CALC: 107 mg/dL — AB (ref 0–99)
NonHDL: 130.03
TRIGLYCERIDES: 114 mg/dL (ref 0.0–149.0)
VLDL: 22.8 mg/dL (ref 0.0–40.0)

## 2017-09-18 LAB — COMPREHENSIVE METABOLIC PANEL
ALT: 15 U/L (ref 0–35)
AST: 10 U/L (ref 0–37)
Albumin: 4.5 g/dL (ref 3.5–5.2)
Alkaline Phosphatase: 81 U/L (ref 39–117)
BUN: 9 mg/dL (ref 6–23)
CALCIUM: 9.8 mg/dL (ref 8.4–10.5)
CHLORIDE: 99 meq/L (ref 96–112)
CO2: 30 meq/L (ref 19–32)
Creatinine, Ser: 0.46 mg/dL (ref 0.40–1.20)
GFR: 144.74 mL/min (ref >60.00)
GLUCOSE: 97 mg/dL (ref 70–99)
Potassium: 5.2 mEq/L — ABNORMAL HIGH (ref 3.5–5.1)
Sodium: 138 mEq/L (ref 135–145)
Total Bilirubin: 0.5 mg/dL (ref 0.2–1.2)
Total Protein: 7.2 g/dL (ref 6.0–8.3)

## 2017-09-18 LAB — TSH: TSH: 3.28 u[IU]/mL (ref 0.35–4.50)

## 2017-09-18 LAB — HEMOGLOBIN A1C: Hgb A1c MFr Bld: 6.9 % — ABNORMAL HIGH (ref 4.6–6.5)

## 2017-09-19 ENCOUNTER — Encounter: Payer: Self-pay | Admitting: Internal Medicine

## 2017-09-19 ENCOUNTER — Ambulatory Visit (INDEPENDENT_AMBULATORY_CARE_PROVIDER_SITE_OTHER): Payer: Medicare Other | Admitting: Family Medicine

## 2017-09-19 ENCOUNTER — Encounter: Payer: Self-pay | Admitting: Family Medicine

## 2017-09-19 VITALS — BP 134/76 | HR 93 | Temp 98.4°F | Ht 64.5 in | Wt 174.8 lb

## 2017-09-19 DIAGNOSIS — Z6830 Body mass index (BMI) 30.0-30.9, adult: Secondary | ICD-10-CM

## 2017-09-19 DIAGNOSIS — E875 Hyperkalemia: Secondary | ICD-10-CM

## 2017-09-19 DIAGNOSIS — Z1231 Encounter for screening mammogram for malignant neoplasm of breast: Secondary | ICD-10-CM | POA: Diagnosis not present

## 2017-09-19 DIAGNOSIS — Z1211 Encounter for screening for malignant neoplasm of colon: Secondary | ICD-10-CM

## 2017-09-19 DIAGNOSIS — E2839 Other primary ovarian failure: Secondary | ICD-10-CM

## 2017-09-19 DIAGNOSIS — E7800 Pure hypercholesterolemia, unspecified: Secondary | ICD-10-CM | POA: Diagnosis not present

## 2017-09-19 DIAGNOSIS — E119 Type 2 diabetes mellitus without complications: Secondary | ICD-10-CM | POA: Diagnosis not present

## 2017-09-19 DIAGNOSIS — I10 Essential (primary) hypertension: Secondary | ICD-10-CM

## 2017-09-19 DIAGNOSIS — Z23 Encounter for immunization: Secondary | ICD-10-CM

## 2017-09-19 DIAGNOSIS — E6609 Other obesity due to excess calories: Secondary | ICD-10-CM

## 2017-09-19 DIAGNOSIS — Z0000 Encounter for general adult medical examination without abnormal findings: Secondary | ICD-10-CM | POA: Insufficient documentation

## 2017-09-19 MED ORDER — METFORMIN HCL 1000 MG PO TABS: 1000.0000 mg | ORAL_TABLET | Freq: Two times a day (BID) | ORAL | 3 refills | Status: DC

## 2017-09-19 MED ORDER — GLIPIZIDE ER 5 MG PO TB24: 5.0000 mg | ORAL_TABLET | Freq: Every day | ORAL | 3 refills | Status: DC

## 2017-09-19 MED ORDER — MONTELUKAST SODIUM 10 MG PO TABS: 10.0000 mg | ORAL_TABLET | Freq: Every day | ORAL | 3 refills | Status: DC

## 2017-09-19 MED ORDER — LOSARTAN POTASSIUM 100 MG PO TABS: 100.0000 mg | ORAL_TABLET | Freq: Every day | ORAL | 3 refills | Status: DC

## 2017-09-19 MED ORDER — ATORVASTATIN CALCIUM 10 MG PO TABS: 10.0000 mg | ORAL_TABLET | Freq: Every day | ORAL | 3 refills | Status: DC

## 2017-09-19 MED ORDER — AMLODIPINE BESYLATE 5 MG PO TABS: 5.0000 mg | ORAL_TABLET | Freq: Every day | ORAL | 3 refills | Status: DC

## 2017-09-19 NOTE — Progress Notes (Signed)
Subjective:    Patient ID: Katrina Smith, female    DOB: October 28, 1951, 66 y.o.   MRN: [id number]  HPI Here for welcome to medicare visit   I have personally reviewed the Medicare Annual Wellness questionnaire and have noted 1. The patient's medical and social history 2. Their use of alcohol, tobacco or illicit drugs 3. Their current medications and supplements 4. The patient's functional ability including ADL's, fall risks, home safety risks and hearing or visual             impairment. 5. Diet and physical activities 6. Evidence for depression or mood disorders  The patients weight, height, BMI have been recorded in the chart and visual acuity is per eye clinic.  I have made referrals, counseling and provided education to the patient based review of the above and I have provided the pt with a written personalized care plan for preventive services. Reviewed and updated provider list, see scanned forms.  See scanned forms.  Routine anticipatory guidance given to patient.  See health maintenance. Colon cancer screening colonoscopy 12/15 with 3 y recall  Breast cancer screening- mammogram 1/16- did not get one this year  Self breast exam- no lumps  Flu vaccine-today Tetanus vaccine  Td 09 Pneumovax- for prevnar today  Zoster vaccine-interested in shingrix if affordable  dexa -no falls or fractures  Does not take ca or D Advance directive - does not have a living will or poa  Cognitive function addressed- see scanned forms- and if abnormal then additional documentation follows. No worries/memory is good   PMH and SH reviewed  Meds, vitals, and allergies reviewed.   ROS: See HPI.  Otherwise negative.     Hearing Screening   125Hz  250Hz  500Hz  1000Hz  2000Hz  3000Hz  4000Hz  6000Hz  8000Hz   Right ear:   40 40 40  40    Left ear:   40 40 40  40    Vision Screening Comments: Pt goes to [facility] for her yearly eye exam    Wt Readings from Last 3 Encounters:  09/19/17 174  lb 12 oz (79.3 kg)  04/09/17 175 lb 8 oz (79.6 kg)  09/25/16 174 lb 11.2 oz (79.2 kg)  diet is very good  Walks regularly (getting ready to move now)  29.53 kg/m   bp is stable today  No cp or palpitations or headaches or edema  No side effects to medicines  BP Readings from Last 3 Encounters:  09/19/17 134/76  04/09/17 128/70  09/25/16 128/75      DM2 Lab Results  Component Value Date   HGBA1C 6.9 (H) 09/18/2017  up from 6.4  Diet is good - ? Perhaps less exercise  She has changed up her glipizide and metformin dosing times  Checks glucose - and takes glipizide if it is not too low    K runs a bit high usually (on losartan)  Lab Results  Component Value Date   CREATININE 0.46 09/18/2017   BUN 9 09/18/2017   NA 138 09/18/2017   K 5.2 (H) 09/18/2017   CL 99 09/18/2017   CO2 30 09/18/2017    Lab Results  Component Value Date   ALT 15 09/18/2017   AST 10 09/18/2017   ALKPHOS 81 09/18/2017   BILITOT 0.5 09/18/2017    Lab Results  Component Value Date   WBC 10.0 09/18/2017   HGB 12.9 09/18/2017   HCT 40.0 09/18/2017   MCV 92.7 09/18/2017   PLT 372.0 09/18/2017  Lab Results  Component Value Date   TSH 3.28 09/18/2017     Cholesterol  Lab Results  Component Value Date   CHOL 172 09/18/2017   CHOL 248 (H) 04/09/2017   CHOL 153 09/24/2016   Lab Results  Component Value Date   HDL 42.10 09/18/2017   HDL 47.40 04/09/2017   HDL 42.10 09/24/2016   Lab Results  Component Value Date   LDLCALC 107 (H) 09/18/2017   LDLCALC 180 (H) 04/09/2017   LDLCALC 93 09/24/2016   Lab Results  Component Value Date   TRIG 114.0 09/18/2017   TRIG 106.0 04/09/2017   TRIG 91.0 09/24/2016   Lab Results  Component Value Date   CHOLHDL 4 09/18/2017   CHOLHDL 5 04/09/2017   CHOLHDL 4 09/24/2016   Lab Results  Component Value Date   LDLDIRECT 166.9 09/28/2010   LDL is almost at goal  lipitor 10   Patient Active Problem List   Diagnosis Date Noted  . Welcome  to Medicare preventive visit 09/19/2017  . Estrogen deficiency 09/19/2017  . Screening mammogram, encounter for 09/19/2017  . Fecal occult blood test positive 09/28/2014  . Hyperkalemia 09/14/2014  . Lichen sclerosus of female genitalia 04/27/2014  . Colon cancer screening 02/17/2013  . Encounter for routine gynecological examination 02/17/2013  . Other screening mammogram 09/25/2012  . Routine general medical examination at a health care facility 09/01/2012  . Allergic rhinitis   . Toxemia in pregnancy   . Chronic sinusitis   . Obesity 05/27/2008  . Diabetes type 2, controlled ([place]) 07/07/2007  . HYPERCHOLESTEROLEMIA, PURE 07/07/2007  . Essential hypertension 07/07/2007  . ALLERGIC RHINITIS 07/07/2007  . ASTHMA 07/07/2007   Past Medical History:  Diagnosis Date  . Allergic rhinitis   . Allergy   . Asthma    mild  . Chronic sinusitis    had nasal surgery  . Complication of anesthesia    local anesthetics- per pt "wear off quickly"  . Diabetes mellitus type II   . GERD (gastroesophageal reflux disease)   . HTN, white coat   . Hyperlipidemia   . Toxemia in pregnancy    x 2   Past Surgical History:  Procedure Laterality Date  . CESAREAN SECTION     x2  . CHOLECYSTECTOMY  10/2004   gallstones  . NASAL SINUS SURGERY  02/2008   Social History  Substance Use Topics  . Smoking status: Never Smoker  . Smokeless tobacco: Never Used  . Alcohol use No   Family History  Problem Relation Age of Onset  . Hypertension Father   . Diabetes Mother   . Dementia Mother   . Heart disease Mother   . Rheum arthritis Brother   . Cancer Brother        kidney  . Colon cancer Neg Hx   . Esophageal cancer Neg Hx   . Stomach cancer Neg Hx   . Rectal cancer Neg Hx    Allergies  Allergen Reactions  . Other     Poppy seeds  . Xopenex [Levalbuterol] Other (See Comments)    Throat closed up.   Current Outpatient Prescriptions on File Prior to Visit  Medication Sig Dispense Refill   . albuterol (PROAIR HFA) 108 (90 BASE) MCG/ACT inhaler Inhale 2 puffs into the lungs every 6 (six) hours as needed.      Marland Kitchen aspirin EC 81 MG tablet Take 81 mg by mouth daily.      . beclomethasone (QVAR) 40 MCG/ACT inhaler Inhale  2 puffs into the lungs as needed.    . cetirizine (ZYRTEC) 10 MG tablet Take 10 mg by mouth daily.     . clobetasol cream (TEMOVATE) 1.51 % Apply 1 application topically 2 (two) times daily. Apply to outer vulvar area on both sides 30 g 1  . Famotidine (PEPCID PO) Take by mouth as needed.    . fluticasone (FLONASE) 50 MCG/ACT nasal spray 2 sprays by Nasal route daily.      Marland Kitchen glucose blood (ACCU-CHEK COMPACT PLUS) test strip CHECK BLOOD SUGAR ONCE DAILY AND AS NEEDED FOR DM (DX. E11.9) 100 each 1  . glucose blood test strip 1 each by Other route as directed. Use as instructed     . naphazoline-pheniramine (NAPHCON-A) 0.025-0.3 % ophthalmic solution Place 1 drop into both eyes 4 (four) times daily as needed.      . Omega-3 Fatty Acids (FISH OIL PO) Take by mouth. BID     No current facility-administered medications on file prior to visit.     Review of Systems  Constitutional: Negative for activity change, appetite change, fatigue, fever and unexpected weight change.  HENT: Negative for congestion, ear pain, rhinorrhea, sinus pressure and sore throat.   Eyes: Negative for pain, redness and visual disturbance.  Respiratory: Negative for cough, shortness of breath and wheezing.   Cardiovascular: Negative for chest pain and palpitations.  Gastrointestinal: Negative for abdominal pain, blood in stool, constipation and diarrhea.  Endocrine: Negative for polydipsia and polyuria.  Genitourinary: Negative for dysuria, frequency and urgency.  Musculoskeletal: Negative for arthralgias, back pain and myalgias.  Skin: Negative for pallor and rash.  Allergic/Immunologic: Negative for environmental allergies.  Neurological: Negative for dizziness, syncope and headaches.    Hematological: Negative for adenopathy. Does not bruise/bleed easily.  Psychiatric/Behavioral: Negative for decreased concentration and dysphoric mood. The patient is not nervous/anxious.        Objective:   Physical Exam  Constitutional: She appears well-developed and well-nourished. No distress.  obese and well appearing   HENT:  Head: Normocephalic and atraumatic.  Right Ear: External ear normal.  Left Ear: External ear normal.  Mouth/Throat: Oropharynx is clear and moist.  Mod to severe nasal congestion  Eyes: Pupils are equal, round, and reactive to light. Conjunctivae and EOM are normal. No scleral icterus.  Neck: Normal range of motion. Neck supple. No JVD present. Carotid bruit is not present. No thyromegaly present.  Cardiovascular: Normal rate, regular rhythm, normal heart sounds and intact distal pulses.  Exam reveals no gallop.   Pulmonary/Chest: Effort normal and breath sounds normal. No respiratory distress. She has no wheezes. She exhibits no tenderness.  Abdominal: Soft. Bowel sounds are normal. She exhibits no distension, no abdominal bruit and no mass. There is no tenderness.  Genitourinary: No breast swelling, tenderness, discharge or bleeding.  Genitourinary Comments: Breast exam: No mass, nodules, thickening, tenderness, bulging, retraction, inflamation, nipple discharge or skin changes noted.  No axillary or clavicular LA.      Musculoskeletal: Normal range of motion. She exhibits no edema or tenderness.  Lymphadenopathy:    She has no cervical adenopathy.  Neurological: She is alert. She has normal reflexes. No cranial nerve deficit. She exhibits normal muscle tone. Coordination normal.  Skin: Skin is warm and dry. No rash noted. No erythema. No pallor.  Solar lentigines diffusely   Psychiatric: She has a normal mood and affect.          Assessment & Plan:   Problem List Items Addressed This  Visit      Cardiovascular and Mediastinum   Essential  hypertension    bp in fair control at this time  BP Readings from Last 1 Encounters:  09/19/17 134/76   No changes needed Disc lifstyle change with low sodium diet and exercise  Labs rev      Relevant Medications   losartan (COZAAR) 100 MG tablet   atorvastatin (LIPITOR) 10 MG tablet   amLODipine (NORVASC) 5 MG tablet     Endocrine   Diabetes type 2, controlled ([place])    Lab Results  Component Value Date   HGBA1C 6.9 (H) 09/18/2017   This is up  Metformin and glipizide Long disc re: low glycemic diet  Eye and foot care disc  Flu and prevnar imms today      Relevant Medications   metFORMIN (GLUCOPHAGE) 1000 MG tablet   losartan (COZAAR) 100 MG tablet   glipiZIDE (GLUCOTROL XL) 5 MG 24 hr tablet   atorvastatin (LIPITOR) 10 MG tablet     Other   Colon cancer screening    Ref for recall colonoscopy due in dec      Relevant Orders   Ambulatory referral to Gastroenterology   Estrogen deficiency    Post menopausal female Ref for screening dexa       Relevant Orders   DG Bone Density   HYPERCHOLESTEROLEMIA, PURE    Disc goals for lipids and reasons to control them Rev labs with pt Rev low sat fat diet in detail Improved with lipitor  LDL is almost at goal Will continue diet work      Relevant Medications   losartan (COZAAR) 100 MG tablet   atorvastatin (LIPITOR) 10 MG tablet   amLODipine (NORVASC) 5 MG tablet   Hyperkalemia    Very mild Stable Suspect due to ARB Continue to follow No problems with renal fxn      Obesity    Discussed how this problem influences overall health and the risks it imposes  Reviewed plan for weight loss with lower calorie diet (via better food choices and also portion control or program like weight watchers) and exercise building up to or more than 30 minutes 5 days per week including some aerobic activity         Relevant Medications   metFORMIN (GLUCOPHAGE) 1000 MG tablet   glipiZIDE (GLUCOTROL XL) 5 MG 24 hr tablet    Screening mammogram, encounter for    Scheduled annual screening mammogram Nl breast exam today  Encouraged monthly self exams        Relevant Orders   MM DIGITAL SCREENING BILATERAL   Welcome to Medicare preventive visit - Primary    Reviewed health habits including diet and exercise and skin cancer prevention Reviewed appropriate screening tests for age  Also reviewed health mt list, fam hx and immunization status , as well as social and family history    Labs reviewed Disc plan for wt loss Flu shot today and prevnar vaccine today We will refer you for colonoscopy for Dec  We will refer you for mammogram and bone density test   Try to get 1200-1500 mg of calcium per day with at least 1000 iu of vitamin D - for bone health   Please look at the form re: advance directive         Other Visit Diagnoses    Need for influenza vaccination       Relevant Orders   Flu Vaccine QUAD 6+ mos PF IM (Fluarix  Quad PF) (Completed)   Need for vaccination with 13-polyvalent pneumococcal conjugate vaccine       Relevant Orders   Pneumococcal conjugate vaccine 13-valent (Completed)

## 2017-09-19 NOTE — Patient Instructions (Addendum)
Flu shot today and prevnar vaccine today We will refer you for colonoscopy for Dec  We will refer you for mammogram and bone density test   Try to get 1200-1500 mg of calcium per day with at least 1000 iu of vitamin D - for bone health   Please look at the form re: advance directive   Follow up in 6 months

## 2017-09-21 NOTE — Assessment & Plan Note (Signed)
Very mild Stable Suspect due to ARB Continue to follow No problems with renal fxn

## 2017-09-21 NOTE — Assessment & Plan Note (Signed)
Lab Results  Component Value Date   HGBA1C 6.9 (H) 09/18/2017   This is up  Metformin and glipizide Long disc re: low glycemic diet  Eye and foot care disc  Flu and prevnar imms today

## 2017-09-21 NOTE — Assessment & Plan Note (Signed)
Scheduled annual screening mammogram Nl breast exam today  Encouraged monthly self exams   

## 2017-09-21 NOTE — Assessment & Plan Note (Signed)
Reviewed health habits including diet and exercise and skin cancer prevention Reviewed appropriate screening tests for age  Also reviewed health mt list, fam hx and immunization status , as well as social and family history    Labs reviewed Disc plan for wt loss Flu shot today and prevnar vaccine today We will refer you for colonoscopy for Dec  We will refer you for mammogram and bone density test   Try to get 1200-1500 mg of calcium per day with at least 1000 iu of vitamin D - for bone health   Please look at the form re: advance directive

## 2017-09-21 NOTE — Assessment & Plan Note (Signed)
Post menopausal female Ref for screening dexa

## 2017-09-21 NOTE — Assessment & Plan Note (Signed)
Disc goals for lipids and reasons to control them Rev labs with pt Rev low sat fat diet in detail Improved with lipitor  LDL is almost at goal Will continue diet work

## 2017-09-21 NOTE — Assessment & Plan Note (Signed)
Ref for recall colonoscopy due in dec

## 2017-09-21 NOTE — Assessment & Plan Note (Signed)
bp in fair control at this time  BP Readings from Last 1 Encounters:  09/19/17 134/76   No changes needed Disc lifstyle change with low sodium diet and exercise  Labs rev

## 2017-09-21 NOTE — Assessment & Plan Note (Signed)
Discussed how this problem influences overall health and the risks it imposes  Reviewed plan for weight loss with lower calorie diet (via better food choices and also portion control or program like weight watchers) and exercise building up to or more than 30 minutes 5 days per week including some aerobic activity    

## 2017-10-22 ENCOUNTER — Ambulatory Visit
Admission: RE | Admit: 2017-10-22 | Discharge: 2017-10-22 | Disposition: A | Discharge status: Home / Self Care | Payer: Medicare Other | Source: Ambulatory Visit | Attending: Family Medicine | Admitting: Family Medicine

## 2017-10-22 DIAGNOSIS — E2839 Other primary ovarian failure: Secondary | ICD-10-CM

## 2017-10-22 DIAGNOSIS — M810 Age-related osteoporosis without current pathological fracture: Secondary | ICD-10-CM | POA: Diagnosis not present

## 2017-10-22 DIAGNOSIS — Z1231 Encounter for screening mammogram for malignant neoplasm of breast: Secondary | ICD-10-CM | POA: Diagnosis present

## 2017-10-23 ENCOUNTER — Encounter: Payer: Self-pay | Admitting: Family Medicine

## 2017-10-24 ENCOUNTER — Other Ambulatory Visit: Payer: Self-pay | Admitting: Family Medicine

## 2017-10-24 MED ORDER — BECLOMETHASONE DIPROPIONATE 40 MCG/ACT IN AERS: 2.0000 | INHALATION_SPRAY | RESPIRATORY_TRACT | 1 refills | Status: DC | PRN

## 2017-10-24 MED ORDER — EPINEPHRINE 0.3 MG/0.3ML IJ SOAJ: 0.3000 mg | Freq: Once | INTRAMUSCULAR | 1 refills | Status: AC | PRN

## 2017-10-24 NOTE — Telephone Encounter (Signed)
Pt last seen welcome to medicare visit 09/19/17.Please advise.

## 2017-10-24 NOTE — Telephone Encounter (Signed)
Approved: okay #1 x 1 for each

## 2017-10-24 NOTE — Telephone Encounter (Signed)
Copied from [place] [id number]. Topic: Quick Communication - See Telephone Encounter >> Oct 24, 2017  9:45 AM Boyd Kerbs wrote: CRM for notification. See Telephone encounter for:  Patient is calling back to see if any information on messages.  She is needing an epipen and Qvar 80MCG Inhaler for her trip on the 20th Nov.  There will be cats and she is allergic. Please call patient when taken care of.  Pharmacy is [facility] on [address]  10/24/17.

## 2017-10-24 NOTE — Telephone Encounter (Signed)
Rxs sent and pt notified

## 2017-10-26 ENCOUNTER — Encounter: Payer: Self-pay | Admitting: Family Medicine

## 2017-10-26 DIAGNOSIS — M810 Age-related osteoporosis without current pathological fracture: Secondary | ICD-10-CM | POA: Insufficient documentation

## 2017-10-27 ENCOUNTER — Encounter: Payer: Self-pay | Admitting: Family Medicine

## 2017-10-28 MED ORDER — BECLOMETHASONE DIPROP HFA 80 MCG/ACT IN AERB
2.0000 | INHALATION_SPRAY | Freq: Two times a day (BID) | RESPIRATORY_TRACT | 1 refills | Status: DC | PRN
Start: 2017-10-28 — End: 2019-11-03

## 2017-10-28 MED ORDER — BECLOMETHASONE DIPROPIONATE 80 MCG/ACT IN AERS: 2.0000 | INHALATION_SPRAY | Freq: Every day | RESPIRATORY_TRACT | 1 refills | Status: DC | PRN

## 2017-11-19 ENCOUNTER — Encounter: Payer: Self-pay | Admitting: Family Medicine

## 2017-12-01 ENCOUNTER — Encounter: Payer: Medicare Other | Admitting: Internal Medicine

## 2017-12-22 ENCOUNTER — Other Ambulatory Visit: Payer: Self-pay

## 2017-12-22 ENCOUNTER — Ambulatory Visit (AMBULATORY_SURGERY_CENTER): Payer: Self-pay | Admitting: *Deleted

## 2017-12-22 ENCOUNTER — Encounter: Payer: Self-pay | Admitting: Family Medicine

## 2017-12-22 VITALS — Ht 65.0 in | Wt 176.0 lb

## 2017-12-22 DIAGNOSIS — Z86010 Personal history of colonic polyps: Secondary | ICD-10-CM

## 2017-12-22 MED ORDER — NA SULFATE-K SULFATE-MG SULF 17.5-3.13-1.6 GM/177ML PO SOLN: 1.0000 [IU] | Freq: Once | ORAL | 0 refills | Status: AC

## 2017-12-22 MED ORDER — METOCLOPRAMIDE HCL 10 MG PO TABS: 10.0000 mg | ORAL_TABLET | ORAL | 0 refills | Status: DC

## 2017-12-22 NOTE — Addendum Note (Signed)
Addended by: Ronelle Nigh on: 12/22/2017 10:50 AM   Modules accepted: Orders

## 2017-12-22 NOTE — Progress Notes (Addendum)
No egg or soy allergy known to patient   issues with past sedation.  Pt. Aspirated water and procedure was stopped and had colonoscopy with no sedation., no intubation problems  No diet pills per patient No home 02 use per patient  No blood thinners per patient  Pt denies issues with constipation  No A fib or A flutter  EMMI video sent to pt's e mail pt. Declined  See colonoscopy report.  Pt. Had aspirated during the deep sedation and colonoscopy was aborted.  She woke up and wanted to continue with procedure due to her already being prepped.  Dr. Hilarie Fredrickson agreed and she was given [facility] light sedation.  She is to have Reglan prior to procedure and she is to be NPO 6 hours prior to her next colonoscopy.  B.[place], CMA  PV

## 2017-12-26 ENCOUNTER — Encounter: Payer: Self-pay | Admitting: Internal Medicine

## 2018-01-05 ENCOUNTER — Ambulatory Visit (AMBULATORY_SURGERY_CENTER): Payer: Medicare Other | Admitting: Internal Medicine

## 2018-01-05 ENCOUNTER — Other Ambulatory Visit: Payer: Self-pay

## 2018-01-05 ENCOUNTER — Encounter: Payer: Self-pay | Admitting: Internal Medicine

## 2018-01-05 VITALS — BP 133/87 | HR 88 | Temp 97.8°F | Resp 13 | Ht 65.0 in | Wt 176.0 lb

## 2018-01-05 DIAGNOSIS — K635 Polyp of colon: Secondary | ICD-10-CM

## 2018-01-05 DIAGNOSIS — Z86010 Personal history of colonic polyps: Secondary | ICD-10-CM

## 2018-01-05 DIAGNOSIS — D125 Benign neoplasm of sigmoid colon: Secondary | ICD-10-CM

## 2018-01-05 MED ORDER — SODIUM CHLORIDE 0.9 % IV SOLN: 500.0000 mL | Freq: Once | INTRAVENOUS | Status: DC

## 2018-01-05 NOTE — Patient Instructions (Signed)
Handout given on polyps  YOU HAD AN ENDOSCOPIC PROCEDURE TODAY AT THE [facility]:   Refer to the procedure report that was given to you for any specific questions about what was found during the examination.  If the procedure report does not answer your questions, please call your gastroenterologist to clarify.  If you requested that your care partner not be given the details of your procedure findings, then the procedure report has been included in a sealed envelope for you to review at your convenience later.  YOU SHOULD EXPECT: Some feelings of bloating in the abdomen. Passage of more gas than usual.  Walking can help get rid of the air that was put into your GI tract during the procedure and reduce the bloating. If you had a lower endoscopy (such as a colonoscopy or flexible sigmoidoscopy) you may notice spotting of blood in your stool or on the toilet paper. If you underwent a bowel prep for your procedure, you may not have a normal bowel movement for a few days.  Please Note:  You might notice some irritation and congestion in your nose or some drainage.  This is from the oxygen used during your procedure.  There is no need for concern and it should clear up in a day or so.  SYMPTOMS TO REPORT IMMEDIATELY:   Following lower endoscopy (colonoscopy or flexible sigmoidoscopy):  Excessive amounts of blood in the stool  Significant tenderness or worsening of abdominal pains  Swelling of the abdomen that is new, acute  Fever of 100F or higher    For urgent or emergent issues, a gastroenterologist can be reached at any hour by calling [phone].   DIET:  We do recommend a small meal at first, but then you may proceed to your regular diet.  Drink plenty of fluids but you should avoid alcoholic beverages for 24 hours.  ACTIVITY:  You should plan to take it easy for the rest of today and you should NOT DRIVE or use heavy machinery until tomorrow (because of the sedation  medicines used during the test).    FOLLOW UP: Our staff will call the number listed on your records the next business day following your procedure to check on you and address any questions or concerns that you may have regarding the information given to you following your procedure. If we do not reach you, we will leave a message.  However, if you are feeling well and you are not experiencing any problems, there is no need to return our call.  We will assume that you have returned to your regular daily activities without incident.  If any biopsies were taken you will be contacted by phone or by letter within the next 1-3 weeks.  Please call us at [phone] if you have not heard about the biopsies in 3 weeks.    SIGNATURES/CONFIDENTIALITY: You and/or your care partner have signed paperwork which will be entered into your electronic medical record.  These signatures attest to the fact that that the information above on your After Visit Summary has been reviewed and is understood.  Full responsibility of the confidentiality of this discharge information lies with you and/or your care-partner. 

## 2018-01-05 NOTE — Progress Notes (Signed)
Called to room to assist during endoscopic procedure.  Patient ID and intended procedure confirmed with present staff. Received instructions for my participation in the procedure from the performing physician.  

## 2018-01-05 NOTE — Op Note (Signed)
[facility] Patient Name: Katrina Smith Procedure Date: 01/05/2018 9:30 AM MRN: [id number] Endoscopist: Jerene Bears , MD Age: 67 Referring MD:  Date of Birth: 1951-08-17 Gender: Female Account #: [id number] Procedure:                Colonoscopy Indications:              High risk colon cancer surveillance: Personal                            history of multiple (3 or more) adenomas, Last                            colonoscopy: December 2015 Medicines:                Monitored Anesthesia Care Procedure:                Pre-Anesthesia Assessment:                           - Prior to the procedure, a History and Physical                            was performed, and patient medications and                            allergies were reviewed. The patient's tolerance of                            previous anesthesia was also reviewed. The risks                            and benefits of the procedure and the sedation                            options and risks were discussed with the patient.                            All questions were answered, and informed consent                            was obtained. Prior Anticoagulants: The patient has                            taken no previous anticoagulant or antiplatelet                            agents. ASA Grade Assessment: II - A patient with                            mild systemic disease. After reviewing the risks                            and benefits, the patient was deemed in  satisfactory condition to undergo the procedure.                           After obtaining informed consent, the colonoscope                            was passed under direct vision. Throughout the                            procedure, the patient's blood pressure, pulse, and                            oxygen saturations were monitored continuously. The                            Model [id number]-[id number] scope was  introduced                            through the anus and advanced to the the cecum,                            identified by appendiceal orifice and ileocecal                            valve. The colonoscopy was performed without                            difficulty. The patient tolerated the procedure                            well. The quality of the bowel preparation was                            good. The ileocecal valve, appendiceal orifice, and                            rectum were photographed. Scope In: 9:48:44 AM Scope Out: 10:08:30 AM Scope Withdrawal Time: 0 hours 14 minutes 19 seconds  Total Procedure Duration: 0 hours 19 minutes 46 seconds  Findings:                 The digital rectal exam was normal.                           A 6 mm polyp was found in the sigmoid colon. The                            polyp was pedunculated. The polyp was removed with                            a cold snare. Resection and retrieval were complete.                           Multiple small-mouthed diverticula were found in  the sigmoid colon.                           The retroflexed view of the distal rectum and anal                            verge was normal and showed no anal or rectal                            abnormalities. Complications:            No immediate complications. Estimated Blood Loss:     Estimated blood loss was minimal. Impression:               - One 6 mm polyp in the sigmoid colon, removed with                            a cold snare. Resected and retrieved.                           - Diverticulosis in the sigmoid colon.                           - The distal rectum and anal verge are normal on                            retroflexion view. Recommendation:           - Patient has a contact number available for                            emergencies. The signs and symptoms of potential                            delayed complications  were discussed with the                            patient. Return to normal activities tomorrow.                            Written discharge instructions were provided to the                            patient.                           - Resume previous diet.                           - Continue present medications.                           - Await pathology results.                           - Repeat colonoscopy is recommended for  surveillance. The colonoscopy date will be                            determined after pathology results from today's                            exam become available for review. Jerene Bears, MD 01/05/2018 10:13:44 AM This report has been signed electronically.

## 2018-01-05 NOTE — Progress Notes (Signed)
Pt's states no medical or surgical changes since previsit or office visit. 

## 2018-01-05 NOTE — Progress Notes (Signed)
Report given to PACU, vss 

## 2018-01-06 ENCOUNTER — Telehealth: Payer: Self-pay

## 2018-01-06 NOTE — Telephone Encounter (Signed)
  Follow up Call-  Call back number 01/05/2018  Post procedure Call Back phone  # [phone]  Permission to leave phone message Yes  Some recent data might be hidden     Patient questions:  Do you have a fever, pain , or abdominal swelling? No. Pain Score  0 *  Have you tolerated food without any problems? Yes.    Have you been able to return to your normal activities? Yes.    Do you have any questions about your discharge instructions: Diet   No. Medications  No. Follow up visit  No.  Do you have questions or concerns about your Care? No.  Actions: * If pain score is 4 or above: No action needed, pain <4.

## 2018-01-09 ENCOUNTER — Encounter: Payer: Self-pay | Admitting: Internal Medicine

## 2018-02-01 ENCOUNTER — Encounter: Payer: Self-pay | Admitting: Family Medicine

## 2018-02-19 LAB — HM DIABETES EYE EXAM

## 2018-03-15 ENCOUNTER — Telehealth: Payer: Self-pay | Admitting: Family Medicine

## 2018-03-15 DIAGNOSIS — I10 Essential (primary) hypertension: Secondary | ICD-10-CM

## 2018-03-15 DIAGNOSIS — E7800 Pure hypercholesterolemia, unspecified: Secondary | ICD-10-CM

## 2018-03-15 DIAGNOSIS — Z6830 Body mass index (BMI) 30.0-30.9, adult: Secondary | ICD-10-CM

## 2018-03-15 DIAGNOSIS — E6609 Other obesity due to excess calories: Secondary | ICD-10-CM

## 2018-03-15 DIAGNOSIS — E119 Type 2 diabetes mellitus without complications: Secondary | ICD-10-CM

## 2018-03-15 NOTE — Telephone Encounter (Signed)
-----   Message from Lendon Collar, RT sent at 03/11/2018  4:13 PM EDT ----- Regarding: Lab orders for Wed April 3rd Can you please enter lab orders for April 3rd appt? Thanks-Lauren

## 2018-03-18 ENCOUNTER — Other Ambulatory Visit (INDEPENDENT_AMBULATORY_CARE_PROVIDER_SITE_OTHER): Payer: Medicare Other

## 2018-03-18 DIAGNOSIS — E7800 Pure hypercholesterolemia, unspecified: Secondary | ICD-10-CM | POA: Diagnosis not present

## 2018-03-18 DIAGNOSIS — E119 Type 2 diabetes mellitus without complications: Secondary | ICD-10-CM | POA: Diagnosis not present

## 2018-03-18 DIAGNOSIS — I10 Essential (primary) hypertension: Secondary | ICD-10-CM | POA: Diagnosis not present

## 2018-03-18 LAB — LIPID PANEL
CHOL/HDL RATIO: 3
Cholesterol: 148 mg/dL (ref 0–200)
HDL: 45.8 mg/dL (ref >39.00)
LDL Cholesterol: 82 mg/dL (ref 0–99)
NONHDL: 102.35
Triglycerides: 102 mg/dL (ref 0.0–149.0)
VLDL: 20.4 mg/dL (ref 0.0–40.0)

## 2018-03-18 LAB — COMPREHENSIVE METABOLIC PANEL
ALT: 15 U/L (ref 0–35)
AST: 13 U/L (ref 0–37)
Albumin: 4.1 g/dL (ref 3.5–5.2)
Alkaline Phosphatase: 80 U/L (ref 39–117)
BILIRUBIN TOTAL: 0.4 mg/dL (ref 0.2–1.2)
BUN: 10 mg/dL (ref 6–23)
CHLORIDE: 99 meq/L (ref 96–112)
CO2: 28 meq/L (ref 19–32)
Calcium: 9.2 mg/dL (ref 8.4–10.5)
Creatinine, Ser: 0.45 mg/dL (ref 0.40–1.20)
GFR: 148.23 mL/min (ref >60.00)
GLUCOSE: 98 mg/dL (ref 70–99)
Potassium: 4.6 mEq/L (ref 3.5–5.1)
Sodium: 134 mEq/L — ABNORMAL LOW (ref 135–145)
Total Protein: 6.7 g/dL (ref 6.0–8.3)

## 2018-03-18 LAB — HEMOGLOBIN A1C: HEMOGLOBIN A1C: 6.9 % — AB (ref 4.6–6.5)

## 2018-03-20 ENCOUNTER — Encounter: Payer: Self-pay | Admitting: Family Medicine

## 2018-03-20 ENCOUNTER — Ambulatory Visit: Payer: Medicare Other | Admitting: Family Medicine

## 2018-03-20 VITALS — BP 130/76 | HR 98 | Temp 97.6°F | Ht 64.5 in | Wt 176.8 lb

## 2018-03-20 DIAGNOSIS — E7800 Pure hypercholesterolemia, unspecified: Secondary | ICD-10-CM

## 2018-03-20 DIAGNOSIS — E663 Overweight: Secondary | ICD-10-CM | POA: Diagnosis not present

## 2018-03-20 DIAGNOSIS — I10 Essential (primary) hypertension: Secondary | ICD-10-CM

## 2018-03-20 DIAGNOSIS — E119 Type 2 diabetes mellitus without complications: Secondary | ICD-10-CM | POA: Diagnosis not present

## 2018-03-20 DIAGNOSIS — M8000XA Age-related osteoporosis with current pathological fracture, unspecified site, initial encounter for fracture: Secondary | ICD-10-CM | POA: Diagnosis not present

## 2018-03-20 NOTE — Progress Notes (Signed)
Subjective:    Patient ID: Katrina Smith, female    DOB: 1951/04/22, 67 y.o.   MRN: [id number]  HPI Here for 6 mo f/u of chronic health problems   Feels good overall  Not doing a lot lately  Moved back to [place]    Wt Readings from Last 3 Encounters:  03/20/18 176 lb 12 oz (80.2 kg)  01/05/18 176 lb (79.8 kg)  12/22/17 176 lb (79.8 kg)  wt is stable  Doing well with diet  Walking less but joined the silver sneakers program at [employer] - enjoys that /has access to the poos  29.87 kg/m   bp is stable today  No cp or palpitations or headaches or edema  No side effects to medicines  BP Readings from Last 3 Encounters:  03/20/18 130/76  01/05/18 133/87  09/19/17 134/76     Lab Results  Component Value Date   HGBA1C 6.9 (H) 03/18/2018  did not change This is stable from last check  Metformin and glipizide -no highs or lows  Eye care- eye exam march 7 Foot care -no problems   Hyperlipidemia Lab Results  Component Value Date   CHOL 148 03/18/2018   CHOL 172 09/18/2017   CHOL 248 (H) 04/09/2017   Lab Results  Component Value Date   HDL 45.80 03/18/2018   HDL 42.10 09/18/2017   HDL 47.40 04/09/2017   Lab Results  Component Value Date   LDLCALC 82 03/18/2018   LDLCALC 107 (H) 09/18/2017   LDLCALC 180 (H) 04/09/2017   Lab Results  Component Value Date   TRIG 102.0 03/18/2018   TRIG 114.0 09/18/2017   TRIG 106.0 04/09/2017   Lab Results  Component Value Date   CHOLHDL 3 03/18/2018   CHOLHDL 4 09/18/2017   CHOLHDL 5 04/09/2017   Lab Results  Component Value Date   LDLDIRECT 166.9 09/28/2010   Atorvastatin and diet  Started some fish oil  Exercising-starting back   Patient Active Problem List   Diagnosis Date Noted  . Osteoporosis 10/26/2017  . Welcome to Medicare preventive visit 09/19/2017  . Estrogen deficiency 09/19/2017  . Screening mammogram, encounter for 09/19/2017  . Fecal occult blood test positive 09/28/2014  .  Hyperkalemia 09/14/2014  . Lichen sclerosus of female genitalia 04/27/2014  . Colon cancer screening 02/17/2013  . Encounter for routine gynecological examination 02/17/2013  . Other screening mammogram 09/25/2012  . Routine general medical examination at a health care facility 09/01/2012  . Allergic rhinitis   . Toxemia in pregnancy   . Chronic sinusitis   . Overweight (BMI 25.0-29.9) 05/27/2008  . Diabetes type 2, controlled ([place]) 07/07/2007  . HYPERCHOLESTEROLEMIA, PURE 07/07/2007  . Essential hypertension 07/07/2007  . ALLERGIC RHINITIS 07/07/2007  . ASTHMA 07/07/2007   Past Medical History:  Diagnosis Date  . Allergic rhinitis   . Allergy   . Asthma    mild  . Chronic sinusitis    had nasal surgery  . Complication of anesthesia    local anesthetics- per pt "wear off quickly"  . Diabetes mellitus type II   . GERD (gastroesophageal reflux disease)   . HTN, white coat   . Hyperlipidemia   . Osteoporosis   . Toxemia in pregnancy    x 2   Past Surgical History:  Procedure Laterality Date  . CESAREAN SECTION     x2  . CHOLECYSTECTOMY  10/2004   gallstones  . COLONOSCOPY    . NASAL SINUS SURGERY  02/2008  . POLYPECTOMY     Social History   Tobacco Use  . Smoking status: Never Smoker  . Smokeless tobacco: Never Used  Substance Use Topics  . Alcohol use: Yes    Alcohol/week: 0.0 oz    Comment: occasionally  . Drug use: No   Family History  Problem Relation Age of Onset  . Hypertension Father   . Diabetes Mother   . Dementia Mother   . Heart disease Mother   . Rheum arthritis Brother   . Cancer Brother        kidney  . Colon cancer Neg Hx   . Esophageal cancer Neg Hx   . Stomach cancer Neg Hx   . Rectal cancer Neg Hx   . Colon polyps Neg Hx    Allergies  Allergen Reactions  . Other     Poppy seeds  . Xopenex [Levalbuterol] Other (See Comments)    Throat closed up.   Current Outpatient Medications on File Prior to Visit  Medication Sig Dispense  Refill  . albuterol (PROAIR HFA) 108 (90 BASE) MCG/ACT inhaler Inhale 2 puffs into the lungs every 6 (six) hours as needed.      Marland Kitchen amLODipine (NORVASC) 5 MG tablet Take 1 tablet (5 mg total) by mouth daily. 90 tablet 3  . aspirin EC 81 MG tablet Take 81 mg by mouth daily.      Marland Kitchen atorvastatin (LIPITOR) 10 MG tablet Take 1 tablet (10 mg total) by mouth daily. 90 tablet 3  . beclomethasone (QVAR REDIHALER) 80 MCG/ACT inhaler Inhale 2 puffs 2 (two) times daily as needed into the lungs. 1 Inhaler 1  . cetirizine (ZYRTEC) 10 MG tablet Take 10 mg by mouth daily.     . clobetasol cream (TEMOVATE) 4.09 % Apply 1 application topically 2 (two) times daily. Apply to outer vulvar area on both sides 30 g 1  . EPINEPHrine 0.3 mg/0.3 mL IJ SOAJ injection Inject 0.3 mLs (0.3 mg total) once as needed for up to 1 dose into the muscle. 1 Device 1  . Famotidine (PEPCID PO) Take by mouth as needed.    . fluticasone (FLONASE) 50 MCG/ACT nasal spray 2 sprays by Nasal route daily.      Marland Kitchen glipiZIDE (GLUCOTROL XL) 5 MG 24 hr tablet Take 1 tablet (5 mg total) by mouth daily. 90 tablet 3  . glucose blood (ACCU-CHEK COMPACT PLUS) test strip CHECK BLOOD SUGAR ONCE DAILY AND AS NEEDED FOR DM (DX. E11.9) 100 each 1  . glucose blood test strip 1 each by Other route as directed. Use as instructed     . Krill Oil 350 MG CAPS Take 1 capsule by mouth 2 (two) times daily.    Marland Kitchen losartan (COZAAR) 100 MG tablet Take 1 tablet (100 mg total) by mouth daily. 90 tablet 3  . metFORMIN (GLUCOPHAGE) 1000 MG tablet Take 1 tablet (1,000 mg total) by mouth 2 (two) times daily with a meal. 180 tablet 3  . metoCLOPramide (REGLAN) 10 MG tablet Take 1 tablet (10 mg total) by mouth as directed. 1 tablet 0  . montelukast (SINGULAIR) 10 MG tablet Take 1 tablet (10 mg total) by mouth at bedtime. 90 tablet 3  . naphazoline-pheniramine (NAPHCON-A) 0.025-0.3 % ophthalmic solution Place 1 drop into both eyes 4 (four) times daily as needed.       No current  facility-administered medications on file prior to visit.     Review of Systems  Constitutional: Negative for activity change,  appetite change, fatigue, fever and unexpected weight change.  HENT: Negative for congestion, ear pain, rhinorrhea, sinus pressure and sore throat.   Eyes: Negative for pain, redness and visual disturbance.  Respiratory: Negative for cough, shortness of breath and wheezing.   Cardiovascular: Negative for chest pain and palpitations.  Gastrointestinal: Negative for abdominal pain, blood in stool, constipation and diarrhea.  Endocrine: Negative for polydipsia and polyuria.  Genitourinary: Negative for dysuria, frequency and urgency.  Musculoskeletal: Negative for arthralgias, back pain and myalgias.  Skin: Negative for pallor and rash.  Allergic/Immunologic: Negative for environmental allergies.  Neurological: Negative for dizziness, syncope and headaches.  Hematological: Negative for adenopathy. Does not bruise/bleed easily.  Psychiatric/Behavioral: Negative for decreased concentration and dysphoric mood. The patient is not nervous/anxious.        Objective:   Physical Exam  Constitutional: She appears well-developed and well-nourished. No distress.  obese and well appearing   HENT:  Head: Normocephalic and atraumatic.  Mouth/Throat: Oropharynx is clear and moist.  Eyes: Pupils are equal, round, and reactive to light. Conjunctivae and EOM are normal.  Neck: Normal range of motion. Neck supple. No JVD present. Carotid bruit is not present. No thyromegaly present.  Cardiovascular: Normal rate, regular rhythm, normal heart sounds and intact distal pulses. Exam reveals no gallop.  Pulmonary/Chest: Effort normal and breath sounds normal. No respiratory distress. She has no wheezes. She has no rales.  No crackles  Abdominal: Soft. Bowel sounds are normal. She exhibits no distension, no abdominal bruit and no mass. There is no tenderness.  Musculoskeletal: She  exhibits no edema.  No kyphosis   Lymphadenopathy:    She has no cervical adenopathy.  Neurological: She is alert. She has normal reflexes.  Skin: Skin is warm and dry. No rash noted. No pallor.  Psychiatric: She has a normal mood and affect.          Assessment & Plan:   Problem List Items Addressed This Visit      Cardiovascular and Mediastinum   Essential hypertension    bp in fair control at this time  BP Readings from Last 1 Encounters:  03/20/18 130/76   No changes needed Disc lifstyle change with low sodium diet and exercise  Labs reviewed  Wt loss enc         Endocrine   Diabetes type 2, controlled ([place]) - Primary    Lab Results  Component Value Date   HGBA1C 6.9 (H) 03/18/2018   This is stable  Continue metformin and glipizide Sent for eye exam from last mo  Foot care rev  Work on wt loss  F/u 6 mo Plans to get back to regular exercise         Musculoskeletal and Integument   Osteoporosis    Rev dexa  Disc imp of ca and D Disc option of alendronate-info given to consider  Enc fall precautions  Enc exercise        Other   HYPERCHOLESTEROLEMIA, PURE    Disc goals for lipids and reasons to control them Rev labs with pt Rev low sat fat diet in detail Come improvement Enc continue statin and fish oil      Overweight (BMI 25.0-29.9)    Discussed how this problem influences overall health and the risks it imposes  Reviewed plan for weight loss with lower calorie diet (via better food choices and also portion control or program like weight watchers) and exercise building up to or more than 30 minutes 5 days  per week including some aerobic activity

## 2018-03-20 NOTE — Patient Instructions (Addendum)
Continue the fish oil   Stay active- get back to regular exercise  Eat a diabetic diet   Follow up in 6 months for physical   Try to get 1200-1500 mg of calcium per day with at least 1000 iu of vitamin D - for bone health   Take a look at the info on osteoporosis and alendronate

## 2018-03-22 NOTE — Assessment & Plan Note (Signed)
Lab Results  Component Value Date   HGBA1C 6.9 (H) 03/18/2018   This is stable  Continue metformin and glipizide Sent for eye exam from last mo  Foot care rev  Work on wt loss  F/u 6 mo Plans to get back to regular exercise

## 2018-03-22 NOTE — Assessment & Plan Note (Signed)
Discussed how this problem influences overall health and the risks it imposes  Reviewed plan for weight loss with lower calorie diet (via better food choices and also portion control or program like weight watchers) and exercise building up to or more than 30 minutes 5 days per week including some aerobic activity    

## 2018-03-22 NOTE — Assessment & Plan Note (Signed)
Disc goals for lipids and reasons to control them Rev labs with pt Rev low sat fat diet in detail Come improvement Enc continue statin and fish oil

## 2018-03-22 NOTE — Assessment & Plan Note (Signed)
bp in fair control at this time  BP Readings from Last 1 Encounters:  03/20/18 130/76   No changes needed Disc lifstyle change with low sodium diet and exercise  Labs reviewed  Wt loss enc

## 2018-03-22 NOTE — Assessment & Plan Note (Signed)
Rev dexa  Disc imp of ca and D Disc option of alendronate-info given to consider  Enc fall precautions  Enc exercise

## 2018-04-08 ENCOUNTER — Encounter: Payer: Self-pay | Admitting: Family Medicine

## 2018-08-06 ENCOUNTER — Encounter: Payer: Self-pay | Admitting: Family Medicine

## 2018-09-14 ENCOUNTER — Telehealth: Payer: Self-pay | Admitting: Family Medicine

## 2018-09-14 DIAGNOSIS — E119 Type 2 diabetes mellitus without complications: Secondary | ICD-10-CM

## 2018-09-14 DIAGNOSIS — I10 Essential (primary) hypertension: Secondary | ICD-10-CM

## 2018-09-14 DIAGNOSIS — E7800 Pure hypercholesterolemia, unspecified: Secondary | ICD-10-CM

## 2018-09-14 DIAGNOSIS — M8000XA Age-related osteoporosis with current pathological fracture, unspecified site, initial encounter for fracture: Secondary | ICD-10-CM

## 2018-09-14 NOTE — Telephone Encounter (Signed)
-----   Message from Eustace Pen, LPN sent at 0/11/2240  4:41 PM EDT ----- Regarding: Labs 10/7 Lab orders needed. Thank you.  Insurance:  [facility]

## 2018-09-21 ENCOUNTER — Ambulatory Visit (INDEPENDENT_AMBULATORY_CARE_PROVIDER_SITE_OTHER): Payer: Medicare Other

## 2018-09-21 ENCOUNTER — Encounter: Payer: Self-pay | Admitting: Family Medicine

## 2018-09-21 VITALS — BP 138/86 | HR 98 | Temp 98.5°F | Ht 64.5 in | Wt 174.2 lb

## 2018-09-21 DIAGNOSIS — E7800 Pure hypercholesterolemia, unspecified: Secondary | ICD-10-CM

## 2018-09-21 DIAGNOSIS — M8000XA Age-related osteoporosis with current pathological fracture, unspecified site, initial encounter for fracture: Secondary | ICD-10-CM | POA: Diagnosis not present

## 2018-09-21 DIAGNOSIS — E119 Type 2 diabetes mellitus without complications: Secondary | ICD-10-CM | POA: Diagnosis not present

## 2018-09-21 DIAGNOSIS — I10 Essential (primary) hypertension: Secondary | ICD-10-CM | POA: Diagnosis not present

## 2018-09-21 DIAGNOSIS — Z23 Encounter for immunization: Secondary | ICD-10-CM

## 2018-09-21 DIAGNOSIS — Z0000 Encounter for general adult medical examination without abnormal findings: Secondary | ICD-10-CM | POA: Diagnosis not present

## 2018-09-21 LAB — CBC WITH DIFFERENTIAL/PLATELET
BASOS ABS: 0.1 10*3/uL (ref 0.0–0.1)
BASOS PCT: 1 % (ref 0.0–3.0)
EOS ABS: 0.4 10*3/uL (ref 0.0–0.7)
Eosinophils Relative: 4.6 % (ref 0.0–5.0)
HEMATOCRIT: 37.3 % (ref 36.0–46.0)
HEMOGLOBIN: 12.6 g/dL (ref 12.0–15.0)
LYMPHS PCT: 36.4 % (ref 12.0–46.0)
Lymphs Abs: 3.5 10*3/uL (ref 0.7–4.0)
MCHC: 33.7 g/dL (ref 30.0–36.0)
MCV: 88.6 fl (ref 78.0–100.0)
Monocytes Absolute: 0.7 10*3/uL (ref 0.1–1.0)
Monocytes Relative: 6.9 % (ref 3.0–12.0)
NEUTROS ABS: 4.9 10*3/uL (ref 1.4–7.7)
Neutrophils Relative %: 51.1 % (ref 43.0–77.0)
PLATELETS: 355 10*3/uL (ref 150.0–400.0)
RBC: 4.21 Mil/uL (ref 3.87–5.11)
RDW: 12.9 % (ref 11.5–15.5)
WBC: 9.6 10*3/uL (ref 4.0–10.5)

## 2018-09-21 LAB — COMPREHENSIVE METABOLIC PANEL
ALT: 15 U/L (ref 0–35)
AST: 13 U/L (ref 0–37)
Albumin: 4.4 g/dL (ref 3.5–5.2)
Alkaline Phosphatase: 81 U/L (ref 39–117)
BUN: 8 mg/dL (ref 6–23)
CALCIUM: 9.5 mg/dL (ref 8.4–10.5)
CHLORIDE: 96 meq/L (ref 96–112)
CO2: 30 meq/L (ref 19–32)
Creatinine, Ser: 0.5 mg/dL (ref 0.40–1.20)
GFR: 131.06 mL/min (ref >60.00)
Glucose, Bld: 123 mg/dL — ABNORMAL HIGH (ref 70–99)
Potassium: 4.7 mEq/L (ref 3.5–5.1)
Sodium: 133 mEq/L — ABNORMAL LOW (ref 135–145)
Total Bilirubin: 0.3 mg/dL (ref 0.2–1.2)
Total Protein: 7 g/dL (ref 6.0–8.3)

## 2018-09-21 LAB — LIPID PANEL
CHOL/HDL RATIO: 4
Cholesterol: 143 mg/dL (ref 0–200)
HDL: 39.3 mg/dL (ref >39.00)
LDL CALC: 87 mg/dL (ref 0–99)
NONHDL: 103.33
TRIGLYCERIDES: 84 mg/dL (ref 0.0–149.0)
VLDL: 16.8 mg/dL (ref 0.0–40.0)

## 2018-09-21 LAB — VITAMIN D 25 HYDROXY (VIT D DEFICIENCY, FRACTURES): VITD: 17.58 ng/mL — AB (ref 30.00–100.00)

## 2018-09-21 LAB — TSH: TSH: 2.64 u[IU]/mL (ref 0.35–4.50)

## 2018-09-21 LAB — HEMOGLOBIN A1C: Hgb A1c MFr Bld: 6.8 % — ABNORMAL HIGH (ref 4.6–6.5)

## 2018-09-21 NOTE — Patient Instructions (Signed)
Katrina Smith , Thank you for taking time to come for your Medicare Wellness Visit. I appreciate your ongoing commitment to your health goals. Please review the following plan we discussed and let me know if I can assist you in the future.   These are the goals we discussed: Goals    . Increase physical activity     Starting 09/21/2018, I will continue to exercise at gym for 60 minutes 3 days per week. As weather permits, I will continue to walk 1 mile daily.        This is a list of the screening recommended for you and due dates:  Health Maintenance  Topic Date Due  . Tetanus Vaccine  12/16/2019*  .  Hepatitis C: One time screening is recommended by Center for Disease Control  (CDC) for  adults born from [birth year] through 1964.   01/17/2021*  . Mammogram  10/22/2018  . Eye exam for diabetics  02/20/2019  . Complete foot exam   03/21/2019  . Hemoglobin A1C  03/23/2019  . Colon Cancer Screening  01/05/2023  . Flu Shot  Completed  . DEXA scan (bone density measurement)  Completed  . Pneumonia vaccines  Completed  *Topic was postponed. The date shown is not the original due date.   Preventive Care for Adults  A healthy lifestyle and preventive care can promote health and wellness. Preventive health guidelines for adults include the following key practices.  . A routine yearly physical is a good way to check with your health care provider about your health and preventive screening. It is a chance to share any concerns and updates on your health and to receive a thorough exam.  . Visit your dentist for a routine exam and preventive care every 6 months. Brush your teeth twice a day and floss once a day. Good oral hygiene prevents tooth decay and gum disease.  . The frequency of eye exams is based on your age, health, family medical history, use  of contact lenses, and other factors. Follow your health care provider's recommendations for frequency of eye exams.  . Eat a healthy diet. Foods like  vegetables, fruits, whole grains, low-fat dairy products, and lean protein foods contain the nutrients you need without too many calories. Decrease your intake of foods high in solid fats, added sugars, and salt. Eat the right amount of calories for you. Get information about a proper diet from your health care provider, if necessary.  . Regular physical exercise is one of the most important things you can do for your health. Most adults should get at least 150 minutes of moderate-intensity exercise (any activity that increases your heart rate and causes you to sweat) each week. In addition, most adults need muscle-strengthening exercises on 2 or more days a week.  Silver Sneakers may be a benefit available to you. To determine eligibility, you may visit the website: www.silversneakers.com or contact program at (484)240-8241 Mon-Fri between 8AM-8PM.   . Maintain a healthy weight. The body mass index (BMI) is a screening tool to identify possible weight problems. It provides an estimate of body fat based on height and weight. Your health care provider can find your BMI and can help you achieve or maintain a healthy weight.   For adults 20 years and older: ? A BMI below 18.5 is considered underweight. ? A BMI of 18.5 to 24.9 is normal. ? A BMI of 25 to 29.9 is considered overweight. ? A BMI of 30 and above is  considered obese.   . Maintain normal blood lipids and cholesterol levels by exercising and minimizing your intake of saturated fat. Eat a balanced diet with plenty of fruit and vegetables. Blood tests for lipids and cholesterol should begin at age 60 and be repeated every 5 years. If your lipid or cholesterol levels are high, you are over 50, or you are at high risk for heart disease, you may need your cholesterol levels checked more frequently. Ongoing high lipid and cholesterol levels should be treated with medicines if diet and exercise are not working.  . If you smoke, find out from your  health care provider how to quit. If you do not use tobacco, please do not start.  . If you choose to drink alcohol, please do not consume more than 2 drinks per day. One drink is considered to be 12 ounces (355 mL) of beer, 5 ounces (148 mL) of wine, or 1.5 ounces (44 mL) of liquor.  . If you are 2-68 years old, ask your health care provider if you should take aspirin to prevent strokes.  . Use sunscreen. Apply sunscreen liberally and repeatedly throughout the day. You should seek shade when your shadow is shorter than you. Protect yourself by wearing long sleeves, pants, a wide-brimmed hat, and sunglasses year round, whenever you are outdoors.  . Once a month, do a whole body skin exam, using a mirror to look at the skin on your back. Tell your health care provider of new moles, moles that have irregular borders, moles that are larger than a pencil eraser, or moles that have changed in shape or color.

## 2018-09-21 NOTE — Progress Notes (Signed)
Subjective:   Katrina Smith is a 67 y.o. female who presents for Medicare Annual (Subsequent) preventive examination.  Review of Systems:  N/A Cardiac Risk Factors include: advanced age (>18men, >48 women);diabetes mellitus;dyslipidemia;hypertension     Objective:     Vitals: BP 138/86 (BP Location: Right Arm, Patient Position: Sitting, Cuff Size: Normal)   Pulse 98   Temp 98.5 F (36.9 C) (Oral)   Ht 5' 4.5" (1.638 m) Comment: no shoes  Wt 174 lb 4 oz (79 kg)   LMP 12/16/1993   SpO2 100%   BMI 29.45 kg/m   Body mass index is 29.45 kg/m.  Advanced Directives 09/21/2018 01/05/2018 10/28/2014  Does Patient Have a Medical Advance Directive? Yes Yes No  Type of Paramedic of [place];Living will - -  Copy of [place] in Chart? No - copy requested - -    Tobacco Social History   Tobacco Use  Smoking Status Never Smoker  Smokeless Tobacco Never Used     Counseling given: No   Clinical Intake:  Pre-visit preparation completed: Yes  Pain : No/denies pain Pain Score: 0-No pain     Nutritional Status: BMI 25 -29 Overweight Nutritional Risks: None Diabetes: Yes CBG done?: No Did pt. bring in CBG monitor from home?: No  How often do you need to have someone help you when you read instructions, pamphlets, or other written materials from your doctor or pharmacy?: 1 - Never What is the last grade level you completed in school?: 12th grade + 2 yrs college  Interpreter Needed?: No  Comments: pt lives with spouse Information entered by :: LPinson, LPN  Past Medical History:  Diagnosis Date  . Allergic rhinitis   . Allergy   . Asthma    mild  . Chronic sinusitis    had nasal surgery  . Complication of anesthesia    local anesthetics- per pt "wear off quickly"  . Diabetes mellitus type II   . GERD (gastroesophageal reflux disease)   . HTN, white coat   . Hyperlipidemia   . Osteoporosis   . Toxemia in pregnancy    x  2   Past Surgical History:  Procedure Laterality Date  . CESAREAN SECTION     x2  . CHOLECYSTECTOMY  10/2004   gallstones  . COLONOSCOPY    . NASAL SINUS SURGERY  02/2008  . POLYPECTOMY     Family History  Problem Relation Age of Onset  . Hypertension Father   . Diabetes Mother   . Dementia Mother   . Heart disease Mother   . Rheum arthritis Brother   . Cancer Brother        kidney  . Colon cancer Neg Hx   . Esophageal cancer Neg Hx   . Stomach cancer Neg Hx   . Rectal cancer Neg Hx   . Colon polyps Neg Hx    Social History   Socioeconomic History  . Marital status: Married    Spouse name: Not on file  . Number of children: Not on file  . Years of education: Not on file  . Highest education level: Not on file  Occupational History  . Not on file  Social Needs  . Financial resource strain: Not on file  . Food insecurity:    Worry: Not on file    Inability: Not on file  . Transportation needs:    Medical: Not on file    Non-medical: Not on file  Tobacco Use  . Smoking status: Never Smoker  . Smokeless tobacco: Never Used  Substance and Sexual Activity  . Alcohol use: Yes    Alcohol/week: 0.0 standard drinks    Comment: occasionally  . Drug use: No  . Sexual activity: Not on file  Lifestyle  . Physical activity:    Days per week: Not on file    Minutes per session: Not on file  . Stress: Not on file  Relationships  . Social connections:    Talks on phone: Not on file    Gets together: Not on file    Attends religious service: Not on file    Active member of club or organization: Not on file    Attends meetings of clubs or organizations: Not on file    Relationship status: Not on file  Other Topics Concern  . Not on file  Social History Narrative  . Not on file    Outpatient Encounter Medications as of 09/21/2018  Medication Sig  . albuterol (PROAIR HFA) 108 (90 BASE) MCG/ACT inhaler Inhale 2 puffs into the lungs every 6 (six) hours as needed.      Marland Kitchen amLODipine (NORVASC) 5 MG tablet Take 1 tablet (5 mg total) by mouth daily.  Marland Kitchen aspirin EC 81 MG tablet Take 81 mg by mouth daily.    Marland Kitchen atorvastatin (LIPITOR) 10 MG tablet Take 1 tablet (10 mg total) by mouth daily.  . beclomethasone (QVAR REDIHALER) 80 MCG/ACT inhaler Inhale 2 puffs 2 (two) times daily as needed into the lungs.  . cetirizine (ZYRTEC) 10 MG tablet Take 10 mg by mouth daily.   . clobetasol cream (TEMOVATE) 1.61 % Apply 1 application topically 2 (two) times daily. Apply to outer vulvar area on both sides  . EPINEPHrine 0.3 mg/0.3 mL IJ SOAJ injection Inject 0.3 mLs (0.3 mg total) once as needed for up to 1 dose into the muscle.  . Famotidine (PEPCID PO) Take by mouth as needed.  . fluticasone (FLONASE) 50 MCG/ACT nasal spray 2 sprays by Nasal route daily.    Marland Kitchen glipiZIDE (GLUCOTROL XL) 5 MG 24 hr tablet Take 1 tablet (5 mg total) by mouth daily.  Javier Docker Oil 350 MG CAPS Take 1 capsule by mouth 2 (two) times daily.  Marland Kitchen losartan (COZAAR) 100 MG tablet Take 1 tablet (100 mg total) by mouth daily.  . metFORMIN (GLUCOPHAGE) 1000 MG tablet Take 1 tablet (1,000 mg total) by mouth 2 (two) times daily with a meal.  . montelukast (SINGULAIR) 10 MG tablet Take 1 tablet (10 mg total) by mouth at bedtime.  . naphazoline-pheniramine (NAPHCON-A) 0.025-0.3 % ophthalmic solution Place 1 drop into both eyes 4 (four) times daily as needed.    . [DISCONTINUED] glucose blood (ACCU-CHEK COMPACT PLUS) test strip CHECK BLOOD SUGAR ONCE DAILY AND AS NEEDED FOR DM (DX. E11.9)  . [DISCONTINUED] glucose blood test strip 1 each by Other route as directed. Use as instructed   . [DISCONTINUED] metoCLOPramide (REGLAN) 10 MG tablet Take 1 tablet (10 mg total) by mouth as directed.   No facility-administered encounter medications on file as of 09/21/2018.     Activities of Daily Living In your present state of health, do you have any difficulty performing the following activities: 09/21/2018  Hearing? N   Vision? N  Difficulty concentrating or making decisions? N  Walking or climbing stairs? N  Dressing or bathing? N  Doing errands, shopping? N  Preparing Food and eating ? N  Using the Toilet?  N  In the past six months, have you accidently leaked urine? N  Do you have problems with loss of bowel control? N  Managing your Medications? N  Managing your Finances? N  Housekeeping or managing your Housekeeping? N  Some recent data might be hidden    Patient Care Team: Tower, Wynelle Fanny, MD as PCP - General    Assessment:   This is a routine wellness examination for Samani.   Hearing Screening   125Hz  250Hz  500Hz  1000Hz  2000Hz  3000Hz  4000Hz  6000Hz  8000Hz   Right ear:   40 40 40  40    Left ear:   40 40 40  40    Vision Screening Comments: Vision exam in March 2019 @ [facility]  Exercise Activities and Dietary recommendations Current Exercise Habits: Home exercise routine, Type of exercise: strength training/weights;walking(walking 1 mile daily; strength training 60 min 3 days/wk), Frequency (Times/Week): 3, Intensity: Moderate, Exercise limited by: None identified  Goals    . Increase physical activity     Starting 09/21/2018, I will continue to exercise at gym for 60 minutes 3 days per week. As weather permits, I will continue to walk 1 mile daily.        Fall Risk Fall Risk  09/21/2018 09/19/2017 09/25/2016  Falls in the past year? No No No   Depression Screen PHQ 2/9 Scores 09/21/2018 09/19/2017 09/25/2016 02/17/2013  PHQ - 2 Score 0 0 0 0  PHQ- 9 Score 0 0 - -     Cognitive Function MMSE - Mini Mental State Exam 09/21/2018  Orientation to time 5  Orientation to Place 5  Registration 3  Attention/ Calculation 0  Recall 3  Language- name 2 objects 0  Language- repeat 1  Language- follow 3 step command 3  Language- read & follow direction 0  Write a sentence 0  Copy design 0  Total score 20       PLEASE NOTE: A Mini-Cog screen was completed. Maximum score is 20. A value  of 0 denotes this part of [place] MMSE was not completed or the patient failed this part of the Mini-Cog screening.   Mini-Cog Screening Orientation to Time - Max 5 pts Orientation to Place - Max 5 pts Registration - Max 3 pts Recall - Max 3 pts Language Repeat - Max 1 pts Language Follow 3 Step Command - Max 3 pts   Immunization History  Administered Date(s) Administered  . Hepatitis A, Adult 09/22/2015, 04/12/2016  . Influenza Split 10/30/2011, 09/25/2012  . Influenza,inj,Quad PF,6+ Mos 08/27/2013, 09/13/2014, 09/22/2015, 09/25/2016, 09/19/2017, 09/21/2018  . Pneumococcal Conjugate-13 09/19/2017  . Pneumococcal Polysaccharide-23 06/03/2012, 09/21/2018  . Td 12/16/2000, 05/27/2008    Screening Tests Health Maintenance  Topic Date Due  . TETANUS/TDAP  12/16/2019 (Originally 05/27/2018)  . Hepatitis C Screening  01/17/2021 (Originally Apr 10, 1951)  . MAMMOGRAM  10/22/2018  . OPHTHALMOLOGY EXAM  02/20/2019  . FOOT EXAM  03/21/2019  . HEMOGLOBIN A1C  03/23/2019  . COLONOSCOPY  01/05/2023  . INFLUENZA VACCINE  Completed  . DEXA SCAN  Completed  . PNA vac Low Risk Adult  Completed      Plan:    I have personally reviewed, addressed, and noted the following in the patient's chart:  A. Medical and social history B. Use of alcohol, tobacco or illicit drugs  C. Current medications and supplements D. Functional ability and status E.  Nutritional status F.  Physical activity G. Advance directives H. List of other physicians I.  Hospitalizations, surgeries, and  ER visits in previous 12 months J.  [place] to include hearing, vision, cognitive, depression L. Referrals and appointments - none  In addition, I have reviewed and discussed with patient certain preventive protocols, quality metrics, and best practice recommendations. A written personalized care plan for preventive services as well as general preventive health recommendations were provided to patient.  See  attached scanned questionnaire for additional information.   Signed,   Lindell Noe, MHA, BS, LPN Health Coach

## 2018-09-21 NOTE — Progress Notes (Signed)
PCP notes:   Health maintenance:  A1C -completed PPSV23 - completed  Abnormal screenings:   None  Patient concerns:   None  Nurse concerns:  None  Next PCP appt:   10/14/2018  @ 1030  I reviewed health advisor's note, was available for consultation, and agree with documentation and plan. Loura Pardon MD

## 2018-09-25 ENCOUNTER — Encounter: Payer: Medicare Other | Admitting: Family Medicine

## 2018-10-13 ENCOUNTER — Other Ambulatory Visit: Payer: Self-pay | Admitting: *Deleted

## 2018-10-13 MED ORDER — ATORVASTATIN CALCIUM 10 MG PO TABS: 10.0000 mg | ORAL_TABLET | Freq: Every day | ORAL | 3 refills | Status: DC

## 2018-10-13 MED ORDER — AMLODIPINE BESYLATE 5 MG PO TABS: 5.0000 mg | ORAL_TABLET | Freq: Every day | ORAL | 3 refills | Status: DC

## 2018-10-13 MED ORDER — MONTELUKAST SODIUM 10 MG PO TABS: 10.0000 mg | ORAL_TABLET | Freq: Every day | ORAL | 3 refills | Status: DC

## 2018-10-13 MED ORDER — LOSARTAN POTASSIUM 100 MG PO TABS: 100.0000 mg | ORAL_TABLET | Freq: Every day | ORAL | 3 refills | Status: DC

## 2018-10-13 MED ORDER — METFORMIN HCL 1000 MG PO TABS: 1000.0000 mg | ORAL_TABLET | Freq: Two times a day (BID) | ORAL | 3 refills | Status: DC

## 2018-10-14 ENCOUNTER — Encounter: Payer: Self-pay | Admitting: Family Medicine

## 2018-10-14 ENCOUNTER — Ambulatory Visit (INDEPENDENT_AMBULATORY_CARE_PROVIDER_SITE_OTHER): Payer: Medicare Other | Admitting: Family Medicine

## 2018-10-14 VITALS — BP 130/78 | HR 90 | Temp 98.1°F | Ht 64.5 in | Wt 172.5 lb

## 2018-10-14 DIAGNOSIS — I10 Essential (primary) hypertension: Secondary | ICD-10-CM | POA: Diagnosis not present

## 2018-10-14 DIAGNOSIS — E663 Overweight: Secondary | ICD-10-CM

## 2018-10-14 DIAGNOSIS — Z0000 Encounter for general adult medical examination without abnormal findings: Secondary | ICD-10-CM

## 2018-10-14 DIAGNOSIS — E119 Type 2 diabetes mellitus without complications: Secondary | ICD-10-CM

## 2018-10-14 DIAGNOSIS — M8000XA Age-related osteoporosis with current pathological fracture, unspecified site, initial encounter for fracture: Secondary | ICD-10-CM | POA: Diagnosis not present

## 2018-10-14 DIAGNOSIS — E7800 Pure hypercholesterolemia, unspecified: Secondary | ICD-10-CM

## 2018-10-14 MED ORDER — ERGOCALCIFEROL 1.25 MG (50000 UT) PO CAPS: 50000.0000 [IU] | ORAL_CAPSULE | ORAL | 0 refills | Status: AC

## 2018-10-14 MED ORDER — METFORMIN HCL 1000 MG PO TABS: 1000.0000 mg | ORAL_TABLET | Freq: Two times a day (BID) | ORAL | 3 refills | Status: DC

## 2018-10-14 MED ORDER — LOSARTAN POTASSIUM 100 MG PO TABS: 100.0000 mg | ORAL_TABLET | Freq: Every day | ORAL | 3 refills | Status: DC

## 2018-10-14 MED ORDER — AMLODIPINE BESYLATE 5 MG PO TABS: 5.0000 mg | ORAL_TABLET | Freq: Every day | ORAL | 3 refills | Status: DC

## 2018-10-14 MED ORDER — GLIPIZIDE ER 5 MG PO TB24: 5.0000 mg | ORAL_TABLET | Freq: Every day | ORAL | 3 refills | Status: DC

## 2018-10-14 MED ORDER — ATORVASTATIN CALCIUM 10 MG PO TABS: 10.0000 mg | ORAL_TABLET | Freq: Every day | ORAL | 3 refills | Status: DC

## 2018-10-14 MED ORDER — MONTELUKAST SODIUM 10 MG PO TABS: 10.0000 mg | ORAL_TABLET | Freq: Every day | ORAL | 3 refills | Status: DC

## 2018-10-14 NOTE — Assessment & Plan Note (Signed)
bp in fair control at this time  BP Readings from Last 1 Encounters:  10/14/18 130/78   No changes needed Most recent labs reviewed  Disc lifstyle change with low sodium diet and exercise

## 2018-10-14 NOTE — Patient Instructions (Addendum)
Don't forget to schedule your mammogram at [facility]   Your vitamin D is low  Take the weekly ergocalciferol for 12 weeks Also take 4000 iu vit D3 over the counter every day (in addition to the ca plus D supplement)   If you are interested in the new shingles vaccine (Shingrix) - call your local pharmacy to check on coverage and availability  If affordable, get on a wait list at your pharmacy to get the vaccine.     Keep up the good work with diet and exercise

## 2018-10-14 NOTE — Progress Notes (Signed)
Subjective:    Patient ID: Katrina Smith, female    DOB: 01/18/1951, 67 y.o.   MRN: [id number]  HPI Here for health maintenance exam and to review chronic medical problems    Feeling good   Wt Readings from Last 3 Encounters:  10/14/18 172 lb 8 oz (78.2 kg)  09/21/18 174 lb 4 oz (79 kg)  03/20/18 176 lb 12 oz (80.2 kg)  taking really good care of herself  Eating extremely well  Also exercise at least 3 d per week -to the gym / silver sneakers  Walks and outdoor activity also /[place]  29.15 kg/m   Had amw on 10/7 PNA 23 vaccine given   Mammogram 11/18-nl (she will schedule her next one)  Self breast exam -no lumps   Eye exam 3/19  Colonoscopy 1/19 -adenomatous polyp 3 year recall   dexa 11/18 -osteoporosis  No falls or fx  Decided against alendronate  D level is 17.5  Takes ca plus D bid   Zoster status = never had shingles or vaccine  Plans on shingrix vaccine when available   bp is stable today  No cp or palpitations or headaches or edema  No side effects to medicines  BP Readings from Last 3 Encounters:  10/14/18 130/78  09/21/18 138/86  03/20/18 130/76     DM2 Lab Results  Component Value Date   HGBA1C 6.8 (H) 09/21/2018  down from 6.9  Glipizide and metformin  No foot problems    Hyperlipidemia Lab Results  Component Value Date   CHOL 143 09/21/2018   CHOL 148 03/18/2018   CHOL 172 09/18/2017   Lab Results  Component Value Date   HDL 39.30 09/21/2018   HDL 45.80 03/18/2018   HDL 42.10 09/18/2017   Lab Results  Component Value Date   LDLCALC 87 09/21/2018   LDLCALC 82 03/18/2018   LDLCALC 107 (H) 09/18/2017   Lab Results  Component Value Date   TRIG 84.0 09/21/2018   TRIG 102.0 03/18/2018   TRIG 114.0 09/18/2017   Lab Results  Component Value Date   CHOLHDL 4 09/21/2018   CHOLHDL 3 03/18/2018   CHOLHDL 4 09/18/2017   Lab Results  Component Value Date   LDLDIRECT 166.9 09/28/2010  on statin and fish oil  Diet is excellent     Lab Results  Component Value Date   CREATININE 0.50 09/21/2018   BUN 8 09/21/2018   NA 133 (L) 09/21/2018   K 4.7 09/21/2018   CL 96 09/21/2018   CO2 30 09/21/2018   Lab Results  Component Value Date   TSH 2.64 09/21/2018    Patient Active Problem List   Diagnosis Date Noted  . Osteoporosis 10/26/2017  . Welcome to Medicare preventive visit 09/19/2017  . Estrogen deficiency 09/19/2017  . Screening mammogram, encounter for 09/19/2017  . Fecal occult blood test positive 09/28/2014  . Hyperkalemia 09/14/2014  . Lichen sclerosus of female genitalia 04/27/2014  . Colon cancer screening 02/17/2013  . Encounter for routine gynecological examination 02/17/2013  . Other screening mammogram 09/25/2012  . Routine general medical examination at a health care facility 09/01/2012  . Allergic rhinitis   . Toxemia in pregnancy   . Chronic sinusitis   . Overweight (BMI 25.0-29.9) 05/27/2008  . Diabetes type 2, controlled ([place]) 07/07/2007  . HYPERCHOLESTEROLEMIA, PURE 07/07/2007  . Essential hypertension 07/07/2007  . ALLERGIC RHINITIS 07/07/2007  . ASTHMA 07/07/2007   Past Medical History:  Diagnosis Date  . Allergic rhinitis   .  Allergy   . Asthma    mild  . Chronic sinusitis    had nasal surgery  . Complication of anesthesia    local anesthetics- per pt "wear off quickly"  . Diabetes mellitus type II   . GERD (gastroesophageal reflux disease)   . HTN, white coat   . Hyperlipidemia   . Osteoporosis   . Toxemia in pregnancy    x 2   Past Surgical History:  Procedure Laterality Date  . CESAREAN SECTION     x2  . CHOLECYSTECTOMY  10/2004   gallstones  . COLONOSCOPY    . NASAL SINUS SURGERY  02/2008  . POLYPECTOMY     Social History   Tobacco Use  . Smoking status: Never Smoker  . Smokeless tobacco: Never Used  Substance Use Topics  . Alcohol use: Yes    Alcohol/week: 0.0 standard drinks    Comment: occasionally  . Drug use: No   Family History  Problem  Relation Age of Onset  . Hypertension Father   . Diabetes Mother   . Dementia Mother   . Heart disease Mother   . Rheum arthritis Brother   . Cancer Brother        kidney  . Colon cancer Neg Hx   . Esophageal cancer Neg Hx   . Stomach cancer Neg Hx   . Rectal cancer Neg Hx   . Colon polyps Neg Hx    Allergies  Allergen Reactions  . Other     Poppy seeds  . Xopenex [Levalbuterol] Other (See Comments)    Throat closed up.   Current Outpatient Medications on File Prior to Visit  Medication Sig Dispense Refill  . albuterol (PROAIR HFA) 108 (90 BASE) MCG/ACT inhaler Inhale 2 puffs into the lungs every 6 (six) hours as needed.      Marland Kitchen aspirin EC 81 MG tablet Take 81 mg by mouth daily.      . beclomethasone (QVAR REDIHALER) 80 MCG/ACT inhaler Inhale 2 puffs 2 (two) times daily as needed into the lungs. 1 Inhaler 1  . cetirizine (ZYRTEC) 10 MG tablet Take 10 mg by mouth daily.     Marland Kitchen EPINEPHrine 0.3 mg/0.3 mL IJ SOAJ injection Inject 0.3 mLs (0.3 mg total) once as needed for up to 1 dose into the muscle. 1 Device 1  . Famotidine (PEPCID PO) Take by mouth as needed.    . fluticasone (FLONASE) 50 MCG/ACT nasal spray 2 sprays by Nasal route daily.      Javier Docker Oil 350 MG CAPS Take 1 capsule by mouth 2 (two) times daily.    . naphazoline-pheniramine (NAPHCON-A) 0.025-0.3 % ophthalmic solution Place 1 drop into both eyes 4 (four) times daily as needed.       No current facility-administered medications on file prior to visit.      Review of Systems  Constitutional: Negative for activity change, appetite change, fatigue, fever and unexpected weight change.  HENT: Negative for congestion, ear pain, rhinorrhea, sinus pressure and sore throat.   Eyes: Negative for pain, redness and visual disturbance.  Respiratory: Negative for cough, shortness of breath and wheezing.   Cardiovascular: Negative for chest pain and palpitations.  Gastrointestinal: Negative for abdominal pain, blood in stool,  constipation and diarrhea.  Endocrine: Negative for polydipsia and polyuria.  Genitourinary: Negative for dysuria, frequency and urgency.  Musculoskeletal: Negative for arthralgias, back pain and myalgias.  Skin: Negative for pallor and rash.  Allergic/Immunologic: Negative for environmental allergies.  Neurological: Negative  for dizziness, syncope and headaches.  Hematological: Negative for adenopathy. Does not bruise/bleed easily.  Psychiatric/Behavioral: Negative for decreased concentration and dysphoric mood. The patient is not nervous/anxious.        Objective:   Physical Exam  Constitutional: She appears well-developed and well-nourished. No distress.  overwt and well app  HENT:  Head: Normocephalic and atraumatic.  Right Ear: External ear normal.  Left Ear: External ear normal.  Mouth/Throat: Oropharynx is clear and moist.  Baseline nasal cong  Eyes: Pupils are equal, round, and reactive to light. Conjunctivae and EOM are normal. No scleral icterus.  Neck: Normal range of motion. Neck supple. No JVD present. Carotid bruit is not present. No thyromegaly present.  Cardiovascular: Normal rate, regular rhythm and intact distal pulses.  No murmur heard. Pulmonary/Chest: Effort normal and breath sounds normal. No respiratory distress. She has no wheezes. She exhibits no tenderness. No breast tenderness, discharge or bleeding.  Abdominal: Soft. Bowel sounds are normal. She exhibits no distension, no abdominal bruit and no mass. There is no tenderness.  Genitourinary: No breast tenderness, discharge or bleeding.  Genitourinary Comments: Breast exam: No mass, nodules, thickening, tenderness, bulging, retraction, inflamation, nipple discharge or skin changes noted.  No axillary or clavicular LA.      Musculoskeletal: Normal range of motion. She exhibits no edema or tenderness.  Lymphadenopathy:    She has no cervical adenopathy.  Neurological: She is alert. She has normal reflexes. She  displays normal reflexes. No cranial nerve deficit or sensory deficit. She exhibits normal muscle tone. Coordination normal.  Skin: Skin is warm and dry. No rash noted. No erythema. No pallor.  Solar lentigines diffusely   Skin peeling on r foot - ? Tinea   Psychiatric: She has a normal mood and affect.          Assessment & Plan:   Problem List Items Addressed This Visit      Cardiovascular and Mediastinum   Essential hypertension    bp in fair control at this time  BP Readings from Last 1 Encounters:  10/14/18 130/78   No changes needed Most recent labs reviewed  Disc lifstyle change with low sodium diet and exercise        Relevant Medications   amLODipine (NORVASC) 5 MG tablet   atorvastatin (LIPITOR) 10 MG tablet   losartan (COZAAR) 100 MG tablet     Endocrine   Diabetes type 2, controlled ([place])    Lab Results  Component Value Date   HGBA1C 6.8 (H) 09/21/2018   Stable with metformin and glipizide and healthy diet /exercise  Foot and eye care discussed Renal prot disc On statin  imms utd      Relevant Medications   atorvastatin (LIPITOR) 10 MG tablet   glipiZIDE (GLUCOTROL XL) 5 MG 24 hr tablet   losartan (COZAAR) 100 MG tablet   metFORMIN (GLUCOPHAGE) 1000 MG tablet     Musculoskeletal and Integument   Osteoporosis    dexa 11/18 Declines alendronate  Very low D- will tx aggressively  Exercise is good No falls /fx Next dexa in a year       Relevant Medications   ergocalciferol (VITAMIN D2) 50000 units capsule     Other   HYPERCHOLESTEROLEMIA, PURE    Disc goals for lipids and reasons to control them Rev last labs with pt Rev low sat fat diet in detail Continue statin /fish oil and diet       Relevant Medications   amLODipine (NORVASC) 5  MG tablet   atorvastatin (LIPITOR) 10 MG tablet   losartan (COZAAR) 100 MG tablet   Overweight (BMI 25.0-29.9)    Discussed how this problem influences overall health and the risks it imposes  Reviewed  plan for weight loss with lower calorie diet (via better food choices and also portion control or program like weight watchers) and exercise building up to or more than 30 minutes 5 days per week including some aerobic activity         Routine general medical examination at a health care facility - Primary    Reviewed health habits including diet and exercise and skin cancer prevention Reviewed appropriate screening tests for age  Also reviewed health mt list, fam hx and immunization status , as well as social and family history   See HPI amw rev Labs rev  Disc shingrix vaccine  Enc continued good diet/exercise and wt loss  Pt will schedule own mammogram

## 2018-10-14 NOTE — Assessment & Plan Note (Signed)
dexa 11/18 Declines alendronate  Very low D- will tx aggressively  Exercise is good No falls /fx Next dexa in a year

## 2018-10-14 NOTE — Assessment & Plan Note (Signed)
Lab Results  Component Value Date   HGBA1C 6.8 (H) 09/21/2018   Stable with metformin and glipizide and healthy diet /exercise  Foot and eye care discussed Renal prot disc On statin  imms utd

## 2018-10-14 NOTE — Assessment & Plan Note (Addendum)
Reviewed health habits including diet and exercise and skin cancer prevention Reviewed appropriate screening tests for age  Also reviewed health mt list, fam hx and immunization status , as well as social and family history   See HPI amw rev Labs rev  Disc shingrix vaccine  Enc continued good diet/exercise and wt loss  Pt will schedule own mammogram

## 2018-10-14 NOTE — Assessment & Plan Note (Signed)
Disc goals for lipids and reasons to control them Rev last labs with pt Rev low sat fat diet in detail Continue statin /fish oil and diet

## 2018-10-14 NOTE — Assessment & Plan Note (Signed)
Discussed how this problem influences overall health and the risks it imposes  Reviewed plan for weight loss with lower calorie diet (via better food choices and also portion control or program like weight watchers) and exercise building up to or more than 30 minutes 5 days per week including some aerobic activity    

## 2018-10-26 ENCOUNTER — Other Ambulatory Visit: Payer: Self-pay | Admitting: Family Medicine

## 2018-10-26 DIAGNOSIS — Z1231 Encounter for screening mammogram for malignant neoplasm of breast: Secondary | ICD-10-CM

## 2018-10-30 ENCOUNTER — Ambulatory Visit
Admission: RE | Admit: 2018-10-30 | Discharge: 2018-10-30 | Disposition: A | Discharge status: Home / Self Care | Payer: Medicare Other | Source: Ambulatory Visit | Attending: Family Medicine | Admitting: Family Medicine

## 2018-10-30 DIAGNOSIS — Z1231 Encounter for screening mammogram for malignant neoplasm of breast: Secondary | ICD-10-CM | POA: Insufficient documentation

## 2018-11-26 ENCOUNTER — Telehealth: Payer: Self-pay | Admitting: *Deleted

## 2018-11-26 NOTE — Telephone Encounter (Signed)
Received fax to do PA at [email or web address] for pt's qvar redihaler. The system wouldn't let me complete PA because it's not documented that pt has tried and failed all of their alt. Med, it says pt needs to try and fail Arnuity Ellipta, and Flovent HFA or Flovent Diskus before they would approve the qvar, please advise

## 2018-11-26 NOTE — Telephone Encounter (Signed)
Please call and ask her if she has tried any of those and I will complete the prior auth Thanks

## 2018-11-27 NOTE — Telephone Encounter (Signed)
Sounds good - she does not need it now

## 2018-11-27 NOTE — Telephone Encounter (Signed)
Pt said she hasn't tried either med so she is willing to try either, [facility]. Pt said she only uses it PRN and hasn't needed it so she still has a qvar inhaler that's almost full that she will use and once that runs out she will try whatever inhaler Dr. Glori Bickers thinks that she should use

## 2019-01-30 ENCOUNTER — Other Ambulatory Visit: Payer: Self-pay | Admitting: Family Medicine

## 2019-04-01 ENCOUNTER — Encounter: Payer: Self-pay | Admitting: Family Medicine

## 2019-05-26 ENCOUNTER — Encounter: Payer: Self-pay | Admitting: Family Medicine

## 2019-08-09 ENCOUNTER — Encounter: Payer: Self-pay | Admitting: Family Medicine

## 2019-10-05 ENCOUNTER — Ambulatory Visit (INDEPENDENT_AMBULATORY_CARE_PROVIDER_SITE_OTHER): Payer: Medicare Other

## 2019-10-05 ENCOUNTER — Ambulatory Visit: Payer: Medicare Other

## 2019-10-05 DIAGNOSIS — Z0000 Encounter for general adult medical examination without abnormal findings: Secondary | ICD-10-CM

## 2019-10-05 NOTE — Patient Instructions (Signed)
Katrina Smith , Thank you for taking time to come for your Medicare Wellness Visit. I appreciate your ongoing commitment to your health goals. Please review the following plan we discussed and let me know if I can assist you in the future.   Screening recommendations/referrals: Colonoscopy: up to date, completed [id number] Mammogram: up to date, completed 10/30/2018 Bone Density: up to date, completed 10/22/2017 Recommended yearly ophthalmology/optometry visit for glaucoma screening and checkup Recommended yearly dental visit for hygiene and checkup  Vaccinations: Influenza vaccine: will get at next office visit Pneumococcal vaccine: Completed series Tdap vaccine: declined Shingles vaccine: will check with pharmacy    Advanced directives: Please bring a copy of your POA (Power of Attorney) and/or Living Will to your next appointment.   Conditions/risks identified: diabetes, hypertension, hypercholesterolemia  Next appointment: 10/19/2019 @ 8:30 am    Preventive Care 69 Years and Older, Female Preventive care refers to lifestyle choices and visits with your health care provider that can promote health and wellness. What does preventive care include?  A yearly physical exam. This is also called an annual well check.  Dental exams once or twice a year.  Routine eye exams. Ask your health care provider how often you should have your eyes checked.  Personal lifestyle choices, including:  Daily care of your teeth and gums.  Regular physical activity.  Eating a healthy diet.  Avoiding tobacco and drug use.  Limiting alcohol use.  Practicing safe sex.  Taking low-dose aspirin every day.  Taking vitamin and mineral supplements as recommended by your health care provider. What happens during an annual well check? The services and screenings done by your health care provider during your annual well check will depend on your age, overall health, lifestyle risk factors, and family  history of disease. Counseling  Your health care provider may ask you questions about your:  Alcohol use.  Tobacco use.  Drug use.  Emotional well-being.  Home and relationship well-being.  Sexual activity.  Eating habits.  History of falls.  Memory and ability to understand (cognition).  Work and work Statistician.  Reproductive health. Screening  You may have the following tests or measurements:  Height, weight, and BMI.  Blood pressure.  Lipid and cholesterol levels. These may be checked every 5 years, or more frequently if you are over 30 years old.  Skin check.  Lung cancer screening. You may have this screening every year starting at age 59 if you have a 30-pack-year history of smoking and currently smoke or have quit within the past 15 years.  Fecal occult blood test (FOBT) of the stool. You may have this test every year starting at age 12.  Flexible sigmoidoscopy or colonoscopy. You may have a sigmoidoscopy every 5 years or a colonoscopy every 10 years starting at age 70.  Hepatitis C blood test.  Hepatitis B blood test.  Sexually transmitted disease (STD) testing.  Diabetes screening. This is done by checking your blood sugar (glucose) after you have not eaten for a while (fasting). You may have this done every 1-3 years.  Bone density scan. This is done to screen for osteoporosis. You may have this done starting at age 22.  Mammogram. This may be done every 1-2 years. Talk to your health care provider about how often you should have regular mammograms. Talk with your health care provider about your test results, treatment options, and if necessary, the need for more tests. Vaccines  Your health care provider may recommend certain vaccines, such  as:  Influenza vaccine. This is recommended every year.  Tetanus, diphtheria, and acellular pertussis (Tdap, Td) vaccine. You may need a Td booster every 10 years.  Zoster vaccine. You may need this after  age 43.  Pneumococcal 13-valent conjugate (PCV13) vaccine. One dose is recommended after age 40.  Pneumococcal polysaccharide (PPSV23) vaccine. One dose is recommended after age 5. Talk to your health care provider about which screenings and vaccines you need and how often you need them. This information is not intended to replace advice given to you by your health care provider. Make sure you discuss any questions you have with your health care provider. Document Released: 12/29/2015 Document Revised: 08/21/2016 Document Reviewed: 10/03/2015 Elsevier Interactive Patient Education  2017 [place] Prevention in the Home Falls can cause injuries. They can happen to people of all ages. There are many things you can do to make your home safe and to help prevent falls. What can I do on the outside of my home?  Regularly fix the edges of walkways and driveways and fix any cracks.  Remove anything that might make you trip as you walk through a door, such as a raised step or threshold.  Trim any bushes or trees on the path to your home.  Use bright outdoor lighting.  Clear any walking paths of anything that might make someone trip, such as rocks or tools.  Regularly check to see if handrails are loose or broken. Make sure that both sides of any steps have handrails.  Any raised decks and porches should have guardrails on the edges.  Have any leaves, snow, or ice cleared regularly.  Use sand or salt on walking paths during winter.  Clean up any spills in your garage right away. This includes oil or grease spills. What can I do in the bathroom?  Use night lights.  Install grab bars by the toilet and in the tub and shower. Do not use towel bars as grab bars.  Use non-skid mats or decals in the tub or shower.  If you need to sit down in the shower, use a plastic, non-slip stool.  Keep the floor dry. Clean up any water that spills on the floor as soon as it happens.   Remove soap buildup in the tub or shower regularly.  Attach bath mats securely with double-sided non-slip rug tape.  Do not have throw rugs and other things on the floor that can make you trip. What can I do in the bedroom?  Use night lights.  Make sure that you have a light by your bed that is easy to reach.  Do not use any sheets or blankets that are too big for your bed. They should not hang down onto the floor.  Have a firm chair that has side arms. You can use this for support while you get dressed.  Do not have throw rugs and other things on the floor that can make you trip. What can I do in the kitchen?  Clean up any spills right away.  Avoid walking on wet floors.  Keep items that you use a lot in easy-to-reach places.  If you need to reach something above you, use a strong step stool that has a grab bar.  Keep electrical cords out of the way.  Do not use floor polish or wax that makes floors slippery. If you must use wax, use non-skid floor wax.  Do not have throw rugs and other things on the  floor that can make you trip. What can I do with my stairs?  Do not leave any items on the stairs.  Make sure that there are handrails on both sides of the stairs and use them. Fix handrails that are broken or loose. Make sure that handrails are as long as the stairways.  Check any carpeting to make sure that it is firmly attached to the stairs. Fix any carpet that is loose or worn.  Avoid having throw rugs at the top or bottom of the stairs. If you do have throw rugs, attach them to the floor with carpet tape.  Make sure that you have a light switch at the top of the stairs and the bottom of the stairs. If you do not have them, ask someone to add them for you. What else can I do to help prevent falls?  Wear shoes that:  Do not have high heels.  Have rubber bottoms.  Are comfortable and fit you well.  Are closed at the toe. Do not wear sandals.  If you use a  stepladder:  Make sure that it is fully opened. Do not climb a closed stepladder.  Make sure that both sides of the stepladder are locked into place.  Ask someone to hold it for you, if possible.  Clearly mark and make sure that you can see:  Any grab bars or handrails.  First and last steps.  Where the edge of each step is.  Use tools that help you move around (mobility aids) if they are needed. These include:  Canes.  Walkers.  Scooters.  Crutches.  Turn on the lights when you go into a dark area. Replace any light bulbs as soon as they burn out.  Set up your furniture so you have a clear path. Avoid moving your furniture around.  If any of your floors are uneven, fix them.  If there are any pets around you, be aware of where they are.  Review your medicines with your doctor. Some medicines can make you feel dizzy. This can increase your chance of falling. Ask your doctor what other things that you can do to help prevent falls. This information is not intended to replace advice given to you by your health care provider. Make sure you discuss any questions you have with your health care provider. Document Released: 09/28/2009 Document Revised: 05/09/2016 Document Reviewed: 01/06/2015 Elsevier Interactive Patient Education  2017 [employer].

## 2019-10-05 NOTE — Progress Notes (Signed)
PCP notes:  Health Maintenance: needs flu vaccine, will check with pharmacy about Shingrix vaccine  Declined eye exam at this time due to COVID-19. Declined Tdap (thinks she had it in the past 10 years)    Abnormal Screenings: none    Patient concerns: none    Nurse concerns: none    Next PCP appt.: 10/19/2019 @ 8:30 am

## 2019-10-05 NOTE — Progress Notes (Signed)
Subjective:   Katrina Smith is a 68 y.o. female who presents for Medicare Annual (Subsequent) preventive examination.  Review of Systems:    This visit is being conducted through telemedicine via telephone at the nurse health advisor's home address due to the COVID-19 pandemic. This patient has given me verbal consent via doximity to conduct this visit, patient states they are participating from their home address. Patient and myself are on the telephone call. There is no referral for this visit. Some vital signs may be absent or patient reported.    Patient identification: identified by name, DOB, and current address  Cardiac Risk Factors include: advanced age (>86men, >82 women);diabetes mellitus;hypertension     Objective:     Vitals: LMP 12/16/1993   There is no height or weight on file to calculate BMI.  Advanced Directives 10/05/2019 09/21/2018 01/05/2018 10/28/2014  Does Patient Have a Medical Advance Directive? Yes Yes Yes No  Type of Paramedic of [place];Living will [place];Living will - -  Copy of [place] in Chart? No - copy requested No - copy requested - -    Tobacco Social History   Tobacco Use  Smoking Status Never Smoker  Smokeless Tobacco Never Used     Counseling given: Not Answered   Clinical Intake:  Pre-visit preparation completed: Yes  Pain : No/denies pain     Nutritional Risks: None Diabetes: Yes CBG done?: No Did pt. bring in CBG monitor from home?: No  How often do you need to have someone help you when you read instructions, pamphlets, or other written materials from your doctor or pharmacy?: 1 - Never What is the last grade level you completed in school?: some college  Interpreter Needed?: No  Information entered by :: CJohnson, LPN  Past Medical History:  Diagnosis Date  . Allergic rhinitis   . Allergy   . Asthma    mild  . Chronic sinusitis    had nasal surgery   . Complication of anesthesia    local anesthetics- per pt "wear off quickly"  . Diabetes mellitus type II   . GERD (gastroesophageal reflux disease)   . HTN, white coat   . Hyperlipidemia   . Osteoporosis   . Toxemia in pregnancy    x 2   Past Surgical History:  Procedure Laterality Date  . CESAREAN SECTION     x2  . CHOLECYSTECTOMY  10/2004   gallstones  . COLONOSCOPY    . NASAL SINUS SURGERY  02/2008  . POLYPECTOMY     Family History  Problem Relation Age of Onset  . Hypertension Father   . Diabetes Mother   . Dementia Mother   . Heart disease Mother   . Rheum arthritis Brother   . Cancer Brother        kidney  . Colon cancer Neg Hx   . Esophageal cancer Neg Hx   . Stomach cancer Neg Hx   . Rectal cancer Neg Hx   . Colon polyps Neg Hx   . Breast cancer Neg Hx    Social History   Socioeconomic History  . Marital status: Married    Spouse name: Not on file  . Number of children: Not on file  . Years of education: Not on file  . Highest education level: Not on file  Occupational History  . Not on file  Social Needs  . Financial resource strain: Not hard at all  . Food  insecurity    Worry: Never true    Inability: Never true  . Transportation needs    Medical: No    Non-medical: No  Tobacco Use  . Smoking status: Never Smoker  . Smokeless tobacco: Never Used  Substance and Sexual Activity  . Alcohol use: Yes    Alcohol/week: 0.0 standard drinks    Comment: occasionally  . Drug use: No  . Sexual activity: Not on file  Lifestyle  . Physical activity    Days per week: 0 days    Minutes per session: 0 min  . Stress: Not at all  Relationships  . Social Herbalist on phone: Not on file    Gets together: Not on file    Attends religious service: Not on file    Active member of club or organization: Not on file    Attends meetings of clubs or organizations: Not on file    Relationship status: Not on file  Other Topics Concern  . Not on  file  Social History Narrative  . Not on file    Outpatient Encounter Medications as of 10/05/2019  Medication Sig  . albuterol (PROAIR HFA) 108 (90 BASE) MCG/ACT inhaler Inhale 2 puffs into the lungs every 6 (six) hours as needed.    Marland Kitchen amLODipine (NORVASC) 5 MG tablet Take 1 tablet (5 mg total) by mouth daily.  Marland Kitchen aspirin EC 81 MG tablet Take 81 mg by mouth daily.    Marland Kitchen atorvastatin (LIPITOR) 10 MG tablet Take 1 tablet (10 mg total) by mouth daily.  . beclomethasone (QVAR REDIHALER) 80 MCG/ACT inhaler Inhale 2 puffs 2 (two) times daily as needed into the lungs.  . cetirizine (ZYRTEC) 10 MG tablet Take 10 mg by mouth daily.   Marland Kitchen EPINEPHrine 0.3 mg/0.3 mL IJ SOAJ injection Inject 0.3 mLs (0.3 mg total) once as needed for up to 1 dose into the muscle.  . ergocalciferol (VITAMIN D2) 50000 units capsule Take 1 capsule (50,000 Units total) by mouth once a week.  . Famotidine (PEPCID PO) Take by mouth as needed.  . fluticasone (FLONASE) 50 MCG/ACT nasal spray 2 sprays by Nasal route daily.    Marland Kitchen glipiZIDE (GLUCOTROL XL) 5 MG 24 hr tablet Take 1 tablet (5 mg total) by mouth daily.  Javier Docker Oil 350 MG CAPS Take 1 capsule by mouth 2 (two) times daily.  Marland Kitchen losartan (COZAAR) 100 MG tablet Take 1 tablet (100 mg total) by mouth daily.  . metFORMIN (GLUCOPHAGE) 1000 MG tablet Take 1 tablet (1,000 mg total) by mouth 2 (two) times daily with a meal.  . montelukast (SINGULAIR) 10 MG tablet Take 1 tablet (10 mg total) by mouth at bedtime.  . naphazoline-pheniramine (NAPHCON-A) 0.025-0.3 % ophthalmic solution Place 1 drop into both eyes 4 (four) times daily as needed.     No facility-administered encounter medications on file as of 10/05/2019.     Activities of Daily Living In your present state of health, do you have any difficulty performing the following activities: 10/05/2019  Hearing? N  Vision? N  Difficulty concentrating or making decisions? N  Walking or climbing stairs? N  Dressing or bathing? N   Doing errands, shopping? N  Preparing Food and eating ? N  Using the Toilet? N  In the past six months, have you accidently leaked urine? N  Do you have problems with loss of bowel control? N  Managing your Medications? N  Managing your Finances? N  Housekeeping or  managing your Housekeeping? N  Some recent data might be hidden    Patient Care Team: Tower, Wynelle Fanny, MD as PCP - General    Assessment:   This is a routine wellness examination for Suha.  Exercise Activities and Dietary recommendations Current Exercise Habits: Home exercise routine, Type of exercise: walking, Time (Minutes): 30, Frequency (Times/Week): 7, Weekly Exercise (Minutes/Week): 210, Intensity: Mild, Exercise limited by: None identified  Goals    . Increase physical activity     Starting 09/21/2018, I will continue to exercise at gym for 60 minutes 3 days per week. As weather permits, I will continue to walk 1 mile daily.     . Patient Stated     10/05/2019, I will exercise more daily and try to lose 20-30 lbs.        Fall Risk Fall Risk  10/05/2019 09/21/2018 09/19/2017 09/25/2016  Falls in the past year? 0 No No No  Number falls in past yr: 0 - - -  Injury with Fall? 0 - - -  Risk for fall due to : Medication side effect - - -  Follow up Falls evaluation completed;Falls prevention discussed - - -   Is the patient's home free of loose throw rugs in walkways, pet beds, electrical cords, etc?   yes      Grab bars in the bathroom? yes      Handrails on the stairs?   yes      Adequate lighting?   yes  Timed Get Up and Go performed: N/A  Depression Screen PHQ 2/9 Scores 10/05/2019 09/21/2018 09/19/2017 09/25/2016  PHQ - 2 Score 0 0 0 0  PHQ- 9 Score 0 0 0 -     Cognitive Function MMSE - Mini Mental State Exam 10/05/2019 09/21/2018  Orientation to time 5 5  Orientation to Place 5 5  Registration 3 3  Attention/ Calculation 5 0  Recall 3 3  Language- name 2 objects - 0  Language- repeat 1 1   Language- follow 3 step command - 3  Language- read & follow direction - 0  Write a sentence - 0  Copy design - 0  Total score - 20  Mini Cog  Mini-Cog screen was completed. Maximum score is 22. A value of 0 denotes this part of the MMSE was not completed or the patient failed this part of the Mini-Cog screening.      Immunization History  Administered Date(s) Administered  . Hepatitis A, Adult 09/22/2015, 04/12/2016  . Influenza Split 10/30/2011, 09/25/2012  . Influenza,inj,Quad PF,6+ Mos 08/27/2013, 09/13/2014, 09/22/2015, 09/25/2016, 09/19/2017, 09/21/2018  . Pneumococcal Conjugate-13 09/19/2017  . Pneumococcal Polysaccharide-23 06/03/2012, 09/21/2018  . Td 12/16/2000, 05/27/2008    Qualifies for Shingles Vaccine? Yes  Screening Tests Health Maintenance  Topic Date Due  . HEMOGLOBIN A1C  03/23/2019  . INFLUENZA VACCINE  07/17/2019  . TETANUS/TDAP  12/16/2019 (Originally 05/27/2018)  . OPHTHALMOLOGY EXAM  02/14/2020 (Originally 02/20/2019)  . Hepatitis C Screening  01/17/2021 (Originally 1951/02/20)  . FOOT EXAM  10/15/2019  . MAMMOGRAM  10/31/2019  . COLONOSCOPY  01/05/2023  . DEXA SCAN  Completed  . PNA vac Low Risk Adult  Completed    Cancer Screenings: Lung: Low Dose CT Chest recommended if Age 55-80 years, 30 pack-year currently smoking OR have quit w/in 15years. Patient does not qualify. Breast:  Up to date on Mammogram? Yes   Up to date of Bone Density/Dexa? Yes Colorectal: completed 01/05/2018  Additional Screenings:  Hepatitis C  Screening: declined     Plan:    Patient will exercise more daily and try to lose 20-30 lbs.    I have personally reviewed and noted the following in the patient's chart:   . Medical and social history . Use of alcohol, tobacco or illicit drugs  . Current medications and supplements . Functional ability and status . Nutritional status . Physical activity . Advanced directives . List of other physicians . Hospitalizations,  surgeries, and ER visits in previous 12 months . Vitals . Screenings to include cognitive, depression, and falls . Referrals and appointments  In addition, I have reviewed and discussed with patient certain preventive protocols, quality metrics, and best practice recommendations. A written personalized care plan for preventive services as well as general preventive health recommendations were provided to patient.     Andrez Grime, LPN  [id number]

## 2019-10-11 ENCOUNTER — Telehealth: Payer: Self-pay | Admitting: Family Medicine

## 2019-10-11 DIAGNOSIS — E119 Type 2 diabetes mellitus without complications: Secondary | ICD-10-CM

## 2019-10-11 DIAGNOSIS — Z0000 Encounter for general adult medical examination without abnormal findings: Secondary | ICD-10-CM

## 2019-10-11 DIAGNOSIS — E1169 Type 2 diabetes mellitus with other specified complication: Secondary | ICD-10-CM

## 2019-10-11 DIAGNOSIS — I10 Essential (primary) hypertension: Secondary | ICD-10-CM

## 2019-10-11 DIAGNOSIS — E785 Hyperlipidemia, unspecified: Secondary | ICD-10-CM

## 2019-10-11 NOTE — Telephone Encounter (Signed)
-----   Message from Ellamae Sia sent at 10/04/2019 11:48 AM EDT ----- Regarding: Lab orders for Tuesday, 10.27.20 Patient is scheduled for CPX labs, please order future labs, Thanks , Karna Christmas

## 2019-10-12 ENCOUNTER — Other Ambulatory Visit (INDEPENDENT_AMBULATORY_CARE_PROVIDER_SITE_OTHER): Payer: Medicare Other

## 2019-10-12 DIAGNOSIS — E785 Hyperlipidemia, unspecified: Secondary | ICD-10-CM

## 2019-10-12 DIAGNOSIS — E1169 Type 2 diabetes mellitus with other specified complication: Secondary | ICD-10-CM

## 2019-10-12 DIAGNOSIS — E119 Type 2 diabetes mellitus without complications: Secondary | ICD-10-CM

## 2019-10-12 DIAGNOSIS — I10 Essential (primary) hypertension: Secondary | ICD-10-CM

## 2019-10-12 LAB — CBC WITH DIFFERENTIAL/PLATELET
Basophils Absolute: 0.1 10*3/uL (ref 0.0–0.1)
Basophils Relative: 1.3 % (ref 0.0–3.0)
Eosinophils Absolute: 0.4 10*3/uL (ref 0.0–0.7)
Eosinophils Relative: 4.2 % (ref 0.0–5.0)
HCT: 38.8 % (ref 36.0–46.0)
Hemoglobin: 12.7 g/dL (ref 12.0–15.0)
Lymphocytes Relative: 33.6 % (ref 12.0–46.0)
Lymphs Abs: 3.5 10*3/uL (ref 0.7–4.0)
MCHC: 32.8 g/dL (ref 30.0–36.0)
MCV: 91.3 fl (ref 78.0–100.0)
Monocytes Absolute: 0.7 10*3/uL (ref 0.1–1.0)
Monocytes Relative: 6.8 % (ref 3.0–12.0)
Neutro Abs: 5.7 10*3/uL (ref 1.4–7.7)
Neutrophils Relative %: 54.1 % (ref 43.0–77.0)
Platelets: 384 10*3/uL (ref 150.0–400.0)
RBC: 4.24 Mil/uL (ref 3.87–5.11)
RDW: 13 % (ref 11.5–15.5)
WBC: 10.5 10*3/uL (ref 4.0–10.5)

## 2019-10-12 LAB — LIPID PANEL
Cholesterol: 179 mg/dL (ref 0–200)
HDL: 40.7 mg/dL (ref >39.00)
LDL Cholesterol: 113 mg/dL — ABNORMAL HIGH (ref 0–99)
NonHDL: 138.05
Total CHOL/HDL Ratio: 4
Triglycerides: 124 mg/dL (ref 0.0–149.0)
VLDL: 24.8 mg/dL (ref 0.0–40.0)

## 2019-10-12 LAB — COMPREHENSIVE METABOLIC PANEL
ALT: 17 U/L (ref 0–35)
AST: 13 U/L (ref 0–37)
Albumin: 4.5 g/dL (ref 3.5–5.2)
Alkaline Phosphatase: 76 U/L (ref 39–117)
BUN: 12 mg/dL (ref 6–23)
CO2: 31 mEq/L (ref 19–32)
Calcium: 9.6 mg/dL (ref 8.4–10.5)
Chloride: 96 mEq/L (ref 96–112)
Creatinine, Ser: 0.45 mg/dL (ref 0.40–1.20)
GFR: 138.8 mL/min (ref >60.00)
Glucose, Bld: 102 mg/dL — ABNORMAL HIGH (ref 70–99)
Potassium: 5 mEq/L (ref 3.5–5.1)
Sodium: 133 mEq/L — ABNORMAL LOW (ref 135–145)
Total Bilirubin: 0.3 mg/dL (ref 0.2–1.2)
Total Protein: 7.1 g/dL (ref 6.0–8.3)

## 2019-10-12 LAB — HEMOGLOBIN A1C: Hgb A1c MFr Bld: 6.2 % (ref 4.6–6.5)

## 2019-10-14 LAB — TSH: TSH: 3.53 u[IU]/mL (ref 0.35–4.50)

## 2019-10-19 ENCOUNTER — Other Ambulatory Visit: Payer: Self-pay

## 2019-10-19 ENCOUNTER — Encounter: Payer: Self-pay | Admitting: Family Medicine

## 2019-10-19 ENCOUNTER — Ambulatory Visit (INDEPENDENT_AMBULATORY_CARE_PROVIDER_SITE_OTHER): Payer: Medicare Other | Admitting: Family Medicine

## 2019-10-19 VITALS — BP 138/80 | HR 97 | Temp 97.7°F | Ht 64.5 in | Wt 178.2 lb

## 2019-10-19 DIAGNOSIS — E785 Hyperlipidemia, unspecified: Secondary | ICD-10-CM

## 2019-10-19 DIAGNOSIS — Z1231 Encounter for screening mammogram for malignant neoplasm of breast: Secondary | ICD-10-CM

## 2019-10-19 DIAGNOSIS — Z23 Encounter for immunization: Secondary | ICD-10-CM

## 2019-10-19 DIAGNOSIS — Z0000 Encounter for general adult medical examination without abnormal findings: Secondary | ICD-10-CM | POA: Diagnosis not present

## 2019-10-19 DIAGNOSIS — E669 Obesity, unspecified: Secondary | ICD-10-CM

## 2019-10-19 DIAGNOSIS — I10 Essential (primary) hypertension: Secondary | ICD-10-CM | POA: Diagnosis not present

## 2019-10-19 DIAGNOSIS — E1169 Type 2 diabetes mellitus with other specified complication: Secondary | ICD-10-CM | POA: Diagnosis not present

## 2019-10-19 DIAGNOSIS — E2839 Other primary ovarian failure: Secondary | ICD-10-CM

## 2019-10-19 DIAGNOSIS — M8000XA Age-related osteoporosis with current pathological fracture, unspecified site, initial encounter for fracture: Secondary | ICD-10-CM

## 2019-10-19 DIAGNOSIS — E119 Type 2 diabetes mellitus without complications: Secondary | ICD-10-CM

## 2019-10-19 MED ORDER — METFORMIN HCL 1000 MG PO TABS: 1000.0000 mg | ORAL_TABLET | Freq: Two times a day (BID) | ORAL | 3 refills | Status: AC

## 2019-10-19 MED ORDER — ATORVASTATIN CALCIUM 20 MG PO TABS: 20.0000 mg | ORAL_TABLET | Freq: Every day | ORAL | 3 refills | Status: AC

## 2019-10-19 MED ORDER — AMLODIPINE BESYLATE 5 MG PO TABS: 5.0000 mg | ORAL_TABLET | Freq: Every day | ORAL | 3 refills | Status: AC

## 2019-10-19 MED ORDER — MONTELUKAST SODIUM 10 MG PO TABS: 10.0000 mg | ORAL_TABLET | Freq: Every day | ORAL | 3 refills | Status: AC

## 2019-10-19 MED ORDER — LOSARTAN POTASSIUM 100 MG PO TABS: 100.0000 mg | ORAL_TABLET | Freq: Every day | ORAL | 3 refills | Status: AC

## 2019-10-19 MED ORDER — GLIPIZIDE ER 5 MG PO TB24: 5.0000 mg | ORAL_TABLET | Freq: Every day | ORAL | 3 refills | Status: AC

## 2019-10-19 NOTE — Assessment & Plan Note (Addendum)
Reviewed health habits including diet and exercise and skin cancer prevention Reviewed appropriate screening tests for age  amw reviewed  Also reviewed health mt list, fam hx and immunization status , as well as social and family history   See HPI Labs rev  Will get tetanus update if wound in the future Plans to schedule her dm eye exam Will also schedule her own mammogram  dexa ordered  Enc wt loss/diet/exercise  Flu vaccine given

## 2019-10-19 NOTE — Assessment & Plan Note (Signed)
Disc goals for lipids and reasons to control them Rev last labs with pt Rev low sat fat diet in detail  LDL is up -this may be hereditary Increased atorvastatin from 10 to 20 mg daily  Will alert if any side effects or problems   F/u 74mo

## 2019-10-19 NOTE — Assessment & Plan Note (Signed)
Pt has declined alendronate in the past No falls or fx Taking vit D Walking for exercise  dexa ref done

## 2019-10-19 NOTE — Patient Instructions (Addendum)
If you are interested in the new shingles vaccine (Shingrix) - call your local pharmacy to check on coverage and availability  If affordable, get on a wait list at your pharmacy to get the vaccine.  Don't forget to schedule your eye exam when you can   Don't forget to schedule your mammogram    Do take your D and calcium  Vitamin D at least 2000 iu per day   Stop at check out to schedule your bone density test   Keep taking care of yourself

## 2019-10-19 NOTE — Progress Notes (Signed)
Subjective:    Patient ID: Katrina Smith, female    DOB: 09/10/1951, 68 y.o.   MRN: [place]:[id number]  HPI Here for health maintenance exam and to review chronic medical problems   Doing ok  Is in the process of moving- to [place] (she is excited) -has a rental and looking for a condo to buy  Very stressful    Wt Readings from Last 3 Encounters:  10/19/19 178 lb 4 oz (80.9 kg)  10/14/18 172 lb 8 oz (78.2 kg)  09/21/18 174 lb 4 oz (79 kg)  taking care of herself  Eating very healthy  Walks for exercise now  1.5 miles per day 30-40 minutes  Used to go to the gym  30.12 kg/m   Had amw on 10/20 Reviewed   Flu vaccine - give today   shingrix vaccine - is thinking about / has coverage   Tdap 6/09  No exp to babies   Eye exam -was last October  Has put it off due to covid   Mammogram 11/19 - she will set that up at [facility] self breast exam -no lumps   Colonoscopy 1/19 with 5 y   dexa 11/18-osteoporosis  Has declined alendronate [place]-none Fractures-none  Supplements (h/o low D)-taking ca and D most of the time  Exercise-walking   bp is stable today  No cp or palpitations or headaches or edema  No side effects to medicines  BP Readings from Last 3 Encounters:  10/19/19 140/78  10/14/18 130/78  09/21/18 138/86     DM2 Lab Results  Component Value Date   HGBA1C 6.2 10/12/2019   This is improved-control is good  Eye exam -planning  On statin  On arb No hypoglycemia    Hyperlipidemia Lab Results  Component Value Date   CHOL 179 10/12/2019   CHOL 143 09/21/2018   CHOL 148 03/18/2018   Lab Results  Component Value Date   HDL 40.70 10/12/2019   HDL 39.30 09/21/2018   HDL 45.80 03/18/2018   Lab Results  Component Value Date   LDLCALC 113 (H) 10/12/2019   LDLCALC 87 09/21/2018   LDLCALC 82 03/18/2018   Lab Results  Component Value Date   TRIG 124.0 10/12/2019   TRIG 84.0 09/21/2018   TRIG 102.0 03/18/2018   Lab Results  Component Value Date    CHOLHDL 4 10/12/2019   CHOLHDL 4 09/21/2018   CHOLHDL 3 03/18/2018   Lab Results  Component Value Date   LDLDIRECT 166.9 09/28/2010  no red meat  No fatty pork  occ fried food  Not much dairy  No shellfish  This may be genetics  On atorvastatin  Diet -good    Lab Results  Component Value Date   CREATININE 0.45 10/12/2019   BUN 12 10/12/2019   NA 133 (L) 10/12/2019   K 5.0 10/12/2019   CL 96 10/12/2019   CO2 31 10/12/2019  sodium tends to run low  No v/d  Drinks a lot of water and some green tea  Lab Results  Component Value Date   ALT 17 10/12/2019   AST 13 10/12/2019   ALKPHOS 76 10/12/2019   BILITOT 0.3 10/12/2019   Lab Results  Component Value Date   WBC 10.5 10/12/2019   HGB 12.7 10/12/2019   HCT 38.8 10/12/2019   MCV 91.3 10/12/2019   PLT 384.0 10/12/2019   Lab Results  Component Value Date   TSH 3.53 10/12/2019    Patient Active Problem List  Diagnosis Date Noted  . Osteoporosis 10/26/2017  . Welcome to Medicare preventive visit 09/19/2017  . Estrogen deficiency 09/19/2017  . Screening mammogram, encounter for 09/19/2017  . Fecal occult blood test positive 09/28/2014  . Hyperkalemia 09/14/2014  . Lichen sclerosus of female genitalia 04/27/2014  . Colon cancer screening 02/17/2013  . Encounter for routine gynecological examination 02/17/2013  . Other screening mammogram 09/25/2012  . Routine general medical examination at a health care facility 09/01/2012  . Allergic rhinitis   . Toxemia in pregnancy   . Chronic sinusitis   . Obesity (BMI 30-39.9) 05/27/2008  . Diabetes type 2, controlled ([place]) 07/07/2007  . Hyperlipidemia associated with type 2 diabetes mellitus ([place]) 07/07/2007  . Essential hypertension 07/07/2007  . ALLERGIC RHINITIS 07/07/2007  . ASTHMA 07/07/2007   Past Medical History:  Diagnosis Date  . Allergic rhinitis   . Allergy   . Asthma    mild  . Chronic sinusitis    had nasal surgery  . Complication of anesthesia     local anesthetics- per pt "wear off quickly"  . Diabetes mellitus type II   . GERD (gastroesophageal reflux disease)   . HTN, white coat   . Hyperlipidemia   . Osteoporosis   . Toxemia in pregnancy    x 2   Past Surgical History:  Procedure Laterality Date  . CESAREAN SECTION     x2  . CHOLECYSTECTOMY  10/2004   gallstones  . COLONOSCOPY    . NASAL SINUS SURGERY  02/2008  . POLYPECTOMY     Social History   Tobacco Use  . Smoking status: Never Smoker  . Smokeless tobacco: Never Used  Substance Use Topics  . Alcohol use: Yes    Alcohol/week: 0.0 standard drinks    Comment: occasionally  . Drug use: No   Family History  Problem Relation Age of Onset  . Hypertension Father   . Diabetes Mother   . Dementia Mother   . Heart disease Mother   . Rheum arthritis Brother   . Cancer Brother        kidney  . Colon cancer Neg Hx   . Esophageal cancer Neg Hx   . Stomach cancer Neg Hx   . Rectal cancer Neg Hx   . Colon polyps Neg Hx   . Breast cancer Neg Hx    Allergies  Allergen Reactions  . Other     Poppy seeds  . Xopenex [Levalbuterol] Other (See Comments)    Throat closed up.   Current Outpatient Medications on File Prior to Visit  Medication Sig Dispense Refill  . albuterol (PROAIR HFA) 108 (90 BASE) MCG/ACT inhaler Inhale 2 puffs into the lungs every 6 (six) hours as needed.      Marland Kitchen aspirin EC 81 MG tablet Take 81 mg by mouth daily.      . beclomethasone (QVAR REDIHALER) 80 MCG/ACT inhaler Inhale 2 puffs 2 (two) times daily as needed into the lungs. 1 Inhaler 1  . cetirizine (ZYRTEC) 10 MG tablet Take 10 mg by mouth daily.     Marland Kitchen EPINEPHrine 0.3 mg/0.3 mL IJ SOAJ injection Inject 0.3 mLs (0.3 mg total) once as needed for up to 1 dose into the muscle. 1 Device 1  . ergocalciferol (VITAMIN D2) 50000 units capsule Take 1 capsule (50,000 Units total) by mouth once a week. 12 capsule 0  . Famotidine (PEPCID PO) Take by mouth as needed.    . fluticasone (FLONASE) 50  MCG/ACT nasal spray 2  sprays by Nasal route daily.      Javier Docker Oil 350 MG CAPS Take 1 capsule by mouth 2 (two) times daily.    . naphazoline-pheniramine (NAPHCON-A) 0.025-0.3 % ophthalmic solution Place 1 drop into both eyes 4 (four) times daily as needed.       No current facility-administered medications on file prior to visit.      Review of Systems  Constitutional: Negative for activity change, appetite change, fatigue, fever and unexpected weight change.  HENT: Negative for congestion, ear pain, rhinorrhea, sinus pressure and sore throat.   Eyes: Negative for pain, redness and visual disturbance.  Respiratory: Negative for cough, shortness of breath and wheezing.   Cardiovascular: Negative for chest pain and palpitations.  Gastrointestinal: Negative for abdominal pain, blood in stool, constipation and diarrhea.  Endocrine: Negative for polydipsia and polyuria.  Genitourinary: Negative for dysuria, frequency and urgency.  Musculoskeletal: Negative for arthralgias, back pain and myalgias.  Skin: Negative for pallor and rash.  Allergic/Immunologic: Negative for environmental allergies.  Neurological: Negative for dizziness, syncope and headaches.  Hematological: Negative for adenopathy. Does not bruise/bleed easily.  Psychiatric/Behavioral: Negative for decreased concentration and dysphoric mood. The patient is not nervous/anxious.        Significant stressors       Objective:   Physical Exam Constitutional:      General: She is not in acute distress.    Appearance: Normal appearance. She is well-developed. She is obese. She is not ill-appearing or diaphoretic.  HENT:     Head: Normocephalic and atraumatic.     Right Ear: Tympanic membrane, ear canal and external ear normal.     Left Ear: Tympanic membrane, ear canal and external ear normal.     Nose: Nose normal. No congestion.     Mouth/Throat:     Mouth: Mucous membranes are moist.     Pharynx: Oropharynx is clear. No  posterior oropharyngeal erythema.  Eyes:     General: No scleral icterus.    Extraocular Movements: Extraocular movements intact.     Conjunctiva/sclera: Conjunctivae normal.     Pupils: Pupils are equal, round, and reactive to light.  Neck:     Musculoskeletal: Normal range of motion and neck supple. No neck rigidity or muscular tenderness.     Thyroid: No thyromegaly.     Vascular: No carotid bruit or JVD.  Cardiovascular:     Rate and Rhythm: Normal rate and regular rhythm.     Pulses: Normal pulses.     Heart sounds: Normal heart sounds. No gallop.   Pulmonary:     Effort: Pulmonary effort is normal. No respiratory distress.     Breath sounds: Normal breath sounds. No wheezing.     Comments: Good air exch Chest:     Chest wall: No tenderness.  Abdominal:     General: Bowel sounds are normal. There is no distension or abdominal bruit.     Palpations: Abdomen is soft. There is no mass.     Tenderness: There is no abdominal tenderness.     Hernia: No hernia is present.  Genitourinary:    Comments: Breast exam: No mass, nodules, thickening, tenderness, bulging, retraction, inflamation, nipple discharge or skin changes noted.  No axillary or clavicular LA.     Musculoskeletal: Normal range of motion.        General: No tenderness.     Right lower leg: No edema.     Left lower leg: No edema.     Comments:  No kyphosis   Lymphadenopathy:     Cervical: No cervical adenopathy.  Skin:    General: Skin is warm and dry.     Coloration: Skin is not pale.     Findings: No erythema or rash.     Comments: Skin tags  Some lentigines  Neurological:     Mental Status: She is alert. Mental status is at baseline.     Cranial Nerves: No cranial nerve deficit.     Motor: No abnormal muscle tone.     Coordination: Coordination normal.     Gait: Gait normal.     Deep Tendon Reflexes: Reflexes are normal and symmetric. Reflexes normal.  Psychiatric:        Mood and Affect: Mood normal.         Cognition and Memory: Cognition and memory normal.           Assessment & Plan:   Problem List Items Addressed This Visit      Cardiovascular and Mediastinum   Essential hypertension    bp in fair control at this time  BP Readings from Last 1 Encounters:  10/19/19 138/80   No changes needed Most recent labs reviewed  Disc lifstyle change with low sodium diet and exercise        Relevant Medications   atorvastatin (LIPITOR) 20 MG tablet   amLODipine (NORVASC) 5 MG tablet   losartan (COZAAR) 100 MG tablet     Endocrine   Diabetes type 2, controlled ([place])    Very good control  Lab Results  Component Value Date   HGBA1C 6.2 10/12/2019   No change in tx Enc wt loss On arb and statin  Will schedule her own dm eye exam F/u in 6 m      Relevant Medications   atorvastatin (LIPITOR) 20 MG tablet   glipiZIDE (GLUCOTROL XL) 5 MG 24 hr tablet   losartan (COZAAR) 100 MG tablet   metFORMIN (GLUCOPHAGE) 1000 MG tablet   Hyperlipidemia associated with type 2 diabetes mellitus ([place])    Disc goals for lipids and reasons to control them Rev last labs with pt Rev low sat fat diet in detail  LDL is up -this may be hereditary Increased atorvastatin from 10 to 20 mg daily  Will alert if any side effects or problems   F/u 36mo      Relevant Medications   atorvastatin (LIPITOR) 20 MG tablet   glipiZIDE (GLUCOTROL XL) 5 MG 24 hr tablet   losartan (COZAAR) 100 MG tablet   metFORMIN (GLUCOPHAGE) 1000 MG tablet     Musculoskeletal and Integument   Osteoporosis    Pt has declined alendronate in the past No falls or fx Taking vit D Walking for exercise  dexa ref done        Other   Obesity (BMI 30-39.9)    Discussed how this problem influences overall health and the risks it imposes  Reviewed plan for weight loss with lower calorie diet (via better food choices and also portion control or program like weight watchers) and exercise building up to or more than 30 minutes  5 days per week including some aerobic activity         Relevant Medications   glipiZIDE (GLUCOTROL XL) 5 MG 24 hr tablet   metFORMIN (GLUCOPHAGE) 1000 MG tablet   Routine general medical examination at a health care facility - Primary    Reviewed health habits including diet and exercise and skin cancer prevention Reviewed appropriate  screening tests for age  amw reviewed  Also reviewed health mt list, fam hx and immunization status , as well as social and family history   See HPI Labs rev  Will get tetanus update if wound in the future Plans to schedule her dm eye exam Will also schedule her own mammogram  dexa ordered  Enc wt loss/diet/exercise  Flu vaccine given      Estrogen deficiency   Relevant Orders   DG Bone Density   Screening mammogram, encounter for    Pt plans to schedule her own mammogram  Nl breast exam Enc regular self exams        Other Visit Diagnoses    Need for influenza vaccination       Relevant Orders   Flu Vaccine QUAD High Dose(Fluad) (Completed)

## 2019-10-19 NOTE — Assessment & Plan Note (Signed)
Very good control  Lab Results  Component Value Date   HGBA1C 6.2 10/12/2019   No change in tx Enc wt loss On arb and statin  Will schedule her own dm eye exam F/u in 6 m

## 2019-10-19 NOTE — Assessment & Plan Note (Signed)
bp in fair control at this time  BP Readings from Last 1 Encounters:  10/19/19 138/80   No changes needed Most recent labs reviewed  Disc lifstyle change with low sodium diet and exercise

## 2019-10-19 NOTE — Assessment & Plan Note (Signed)
Discussed how this problem influences overall health and the risks it imposes  Reviewed plan for weight loss with lower calorie diet (via better food choices and also portion control or program like weight watchers) and exercise building up to or more than 30 minutes 5 days per week including some aerobic activity    

## 2019-10-19 NOTE — Assessment & Plan Note (Signed)
Pt plans to schedule her own mammogram  Nl breast exam Enc regular self exams

## 2019-10-20 ENCOUNTER — Telehealth: Payer: Self-pay

## 2019-10-20 DIAGNOSIS — Z1231 Encounter for screening mammogram for malignant neoplasm of breast: Secondary | ICD-10-CM

## 2019-10-20 NOTE — Telephone Encounter (Signed)
Pt having mammo@[email or web address] .  Please change to IMG [id number], 3D.

## 2019-10-20 NOTE — Telephone Encounter (Signed)
I put the order in

## 2019-10-20 NOTE — Telephone Encounter (Signed)
Pt due for mammo along w/DEXA. Can you please put in order for 3D mammo, [phone] [id number]?

## 2019-10-20 NOTE — Addendum Note (Signed)
Addended by: Loura Pardon A on: 10/20/2019 03:34 PM   Modules accepted: Orders

## 2019-11-02 ENCOUNTER — Encounter: Payer: Self-pay | Admitting: Family Medicine

## 2019-11-03 MED ORDER — QVAR REDIHALER 80 MCG/ACT IN AERB: 2.0000 | INHALATION_SPRAY | Freq: Two times a day (BID) | RESPIRATORY_TRACT | 3 refills | Status: AC | PRN

## 2019-11-09 ENCOUNTER — Other Ambulatory Visit: Payer: Medicare Other

## 2020-01-29 ENCOUNTER — Encounter: Payer: Self-pay | Admitting: Family Medicine

## 2020-02-24 ENCOUNTER — Ambulatory Visit
Admission: RE | Admit: 2020-02-24 | Discharge: 2020-02-24 | Disposition: A | Discharge status: Home / Self Care | Payer: Medicare PPO | Source: Ambulatory Visit | Attending: Family Medicine | Admitting: Family Medicine

## 2020-02-24 DIAGNOSIS — Z1231 Encounter for screening mammogram for malignant neoplasm of breast: Secondary | ICD-10-CM

## 2020-02-24 DIAGNOSIS — E2839 Other primary ovarian failure: Secondary | ICD-10-CM

## 2020-02-28 ENCOUNTER — Encounter: Payer: Self-pay | Admitting: Family Medicine

## 2020-02-29 ENCOUNTER — Encounter: Payer: Self-pay | Admitting: Family Medicine

## 2020-07-05 ENCOUNTER — Encounter: Payer: Self-pay | Admitting: Family Medicine

## 2020-07-05 NOTE — Telephone Encounter (Signed)
I removed Dr. Glori Bickers as her PCP, will route to Raquel Sarna to f/u with pt regarding getting records

## 2020-12-12 ENCOUNTER — Other Ambulatory Visit: Payer: Self-pay | Admitting: Family Medicine

## 2022-01-07 IMAGING — MG DIGITAL SCREENING BILAT W/ TOMO W/ CAD
8 series · 8 of 24 positions shown · non-contrast
Comparison: Previous exam(s).

CLINICAL DATA: Screening.

EXAM:
DIGITAL SCREENING BILATERAL MAMMOGRAM WITH TOMO AND CAD

[L CC synth-2D]
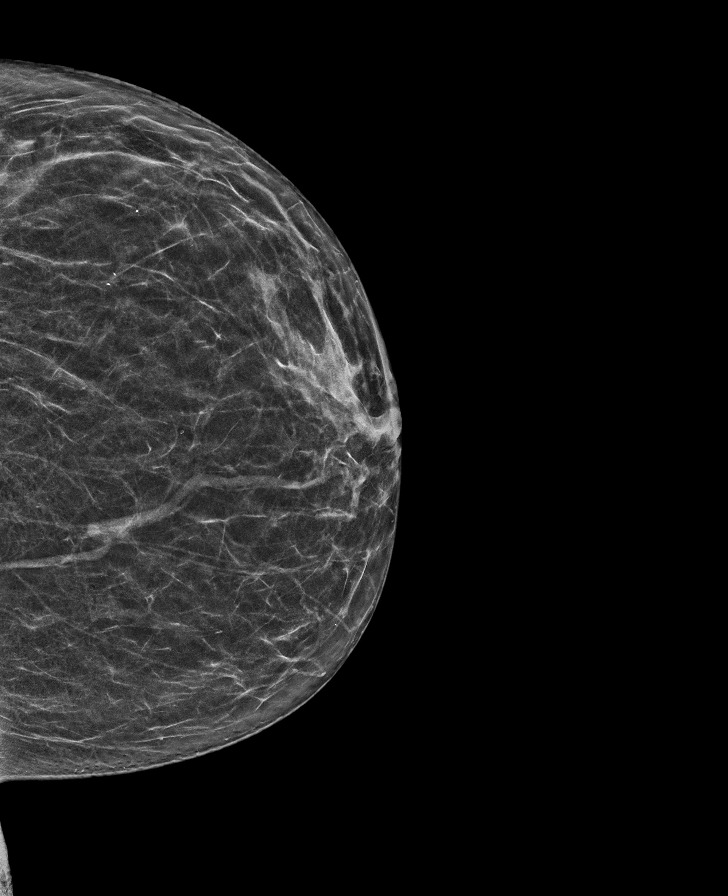

[R CC synth-2D]
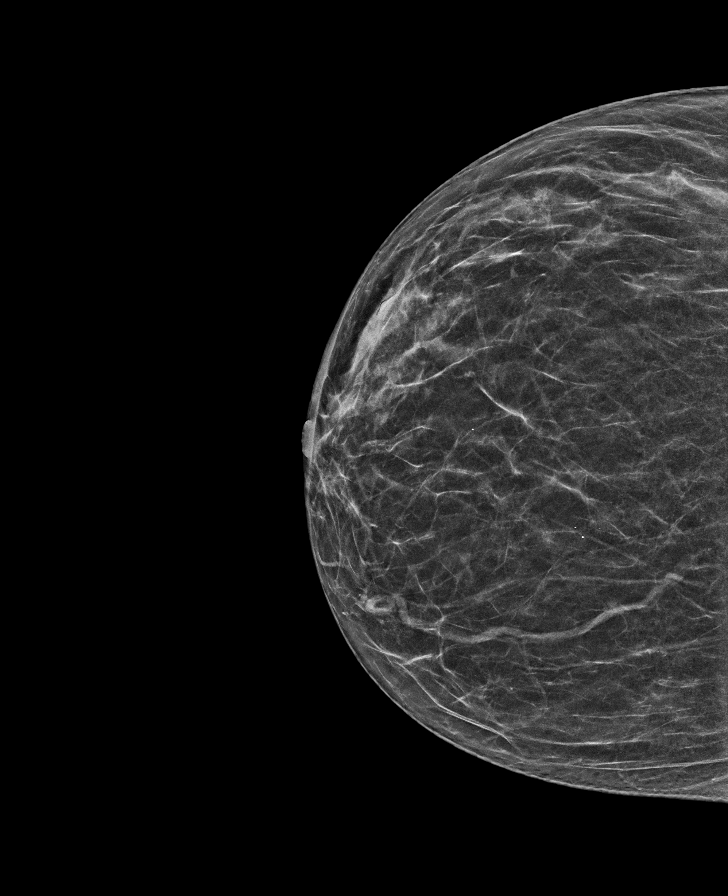

[L MLO synth-2D]
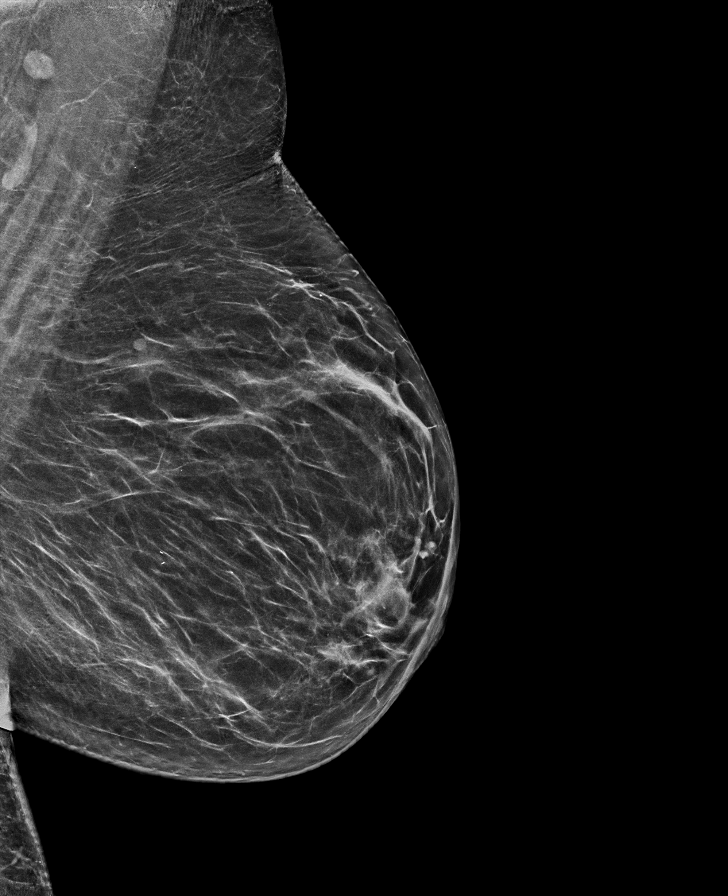

[R MLO synth-2D]
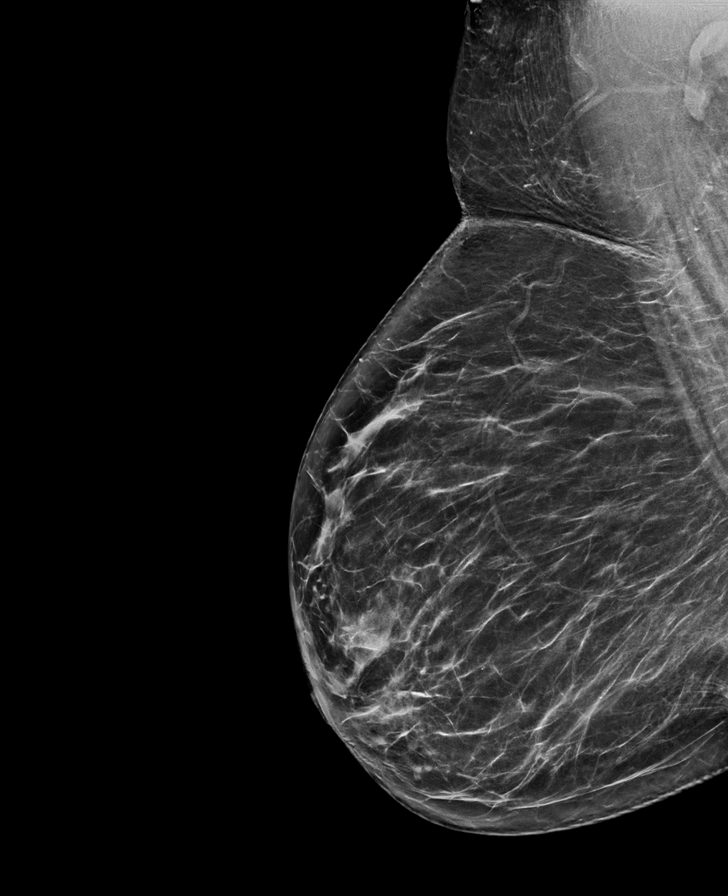

[L CC tomo · tomo slice 29/58.0]
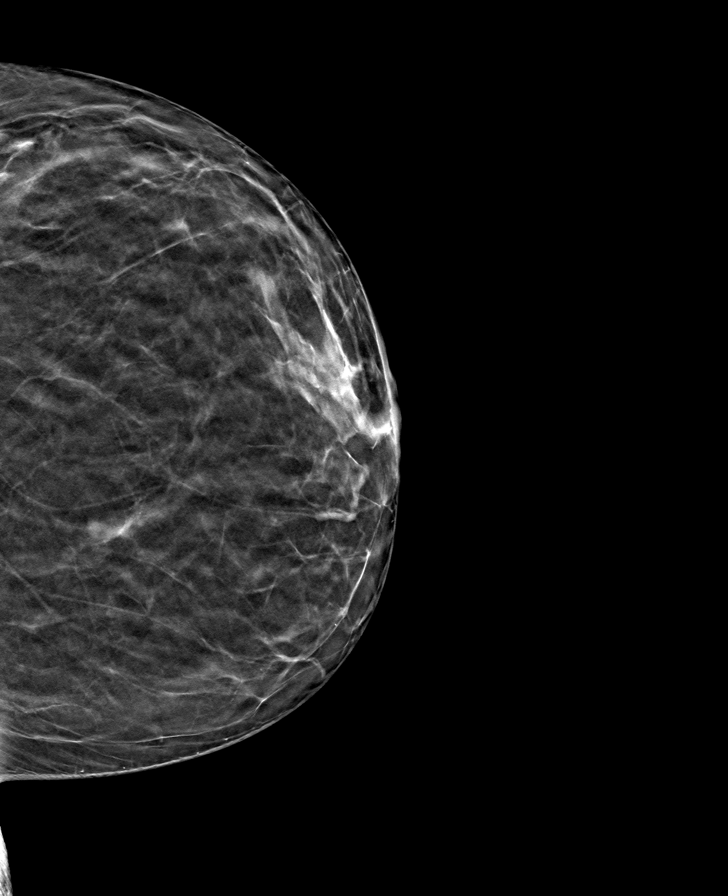

[R MLO tomo · tomo slice 35/70.0]
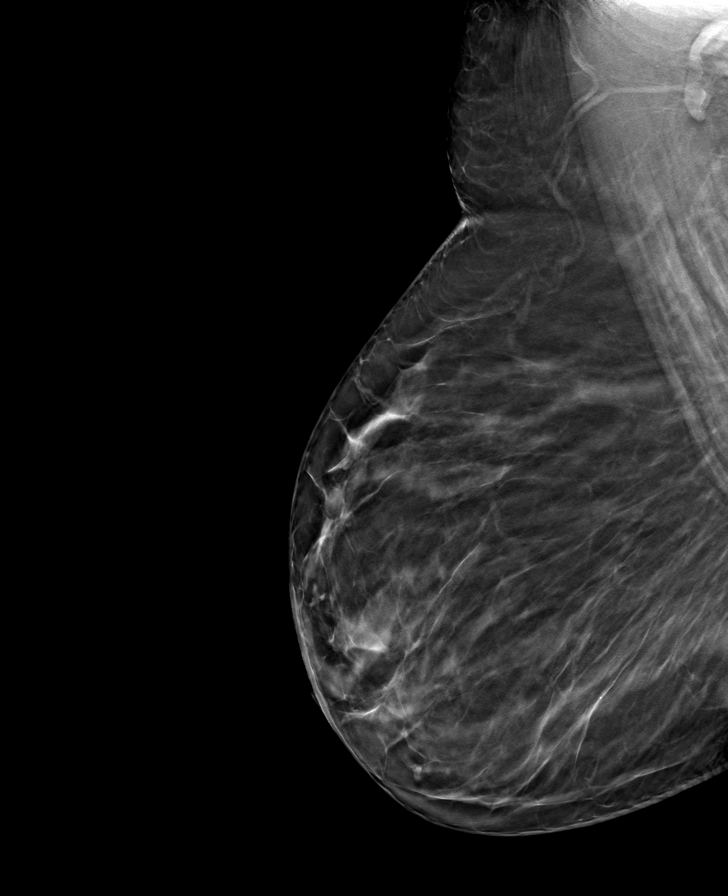

[R CC tomo · tomo slice 27/53.0]
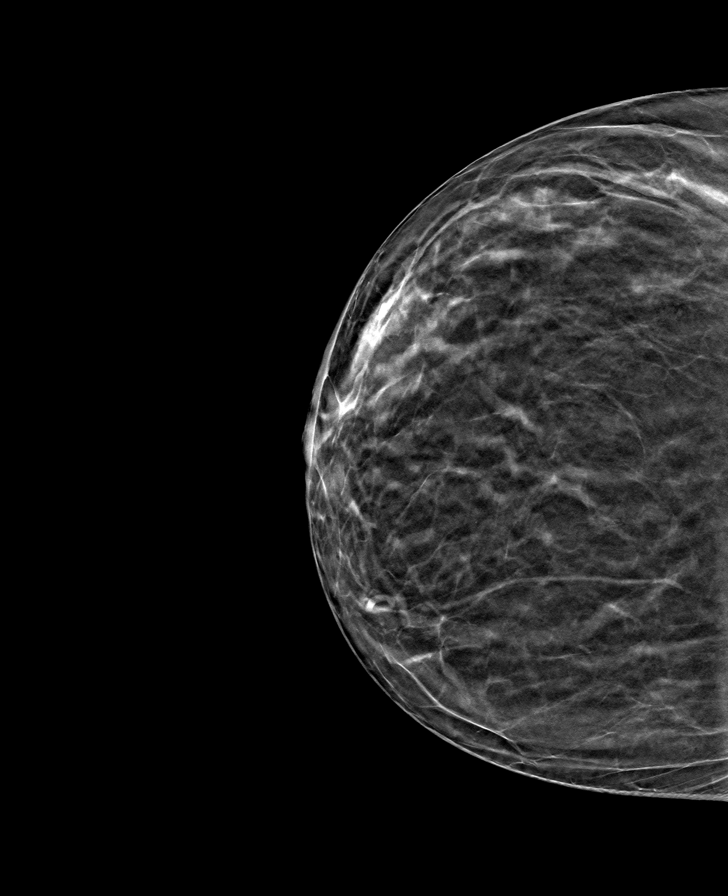

[L MLO tomo · tomo slice 34/67.0]
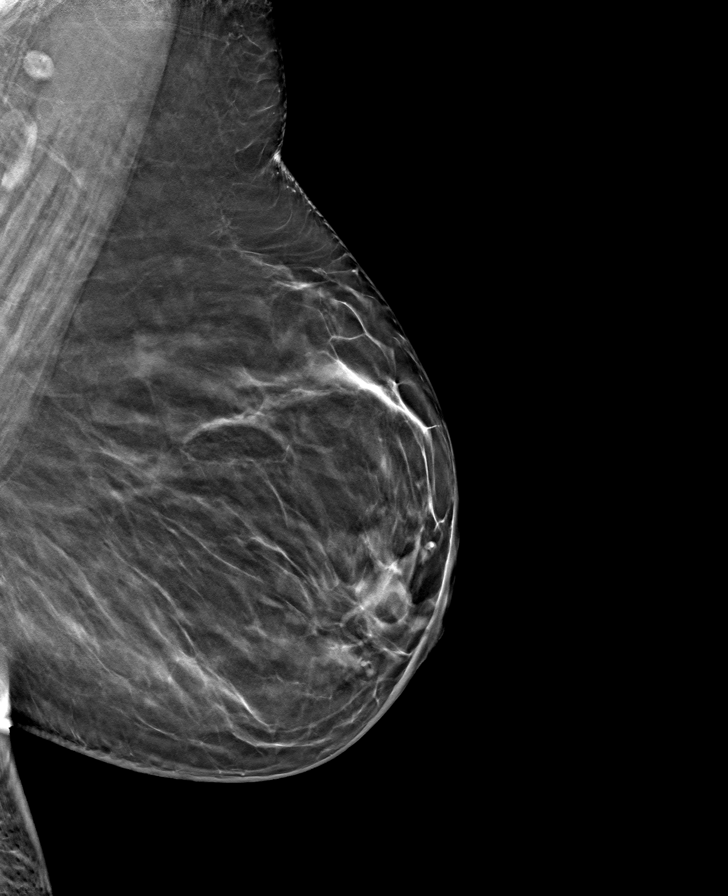

[8 of 24 positions shown; findings below may reference images not displayed]

ACR Breast Density Category b: There are scattered areas of
fibroglandular density.
FINDINGS: There are no findings suspicious for malignancy. Images were
processed with CAD.
IMPRESSION: No mammographic evidence of malignancy. A result letter of this
screening mammogram will be mailed directly to the patient.

RECOMMENDATION:
Screening mammogram in one year. (Code:[id number])

BI-RADS CATEGORY  1: Negative.

## 2023-02-04 ENCOUNTER — Encounter: Payer: Self-pay | Admitting: Internal Medicine
